# Patient Record
Sex: Male | Born: 1955 | Race: Black or African American | Hispanic: No | Smoking: Never smoker
Health system: Southern US, Community
[De-identification: ages and names within clinical notes are randomized; demographics above are authoritative.]

## PROBLEM LIST (undated history)

## (undated) DIAGNOSIS — T7840XA Allergy, unspecified, initial encounter: Secondary | ICD-10-CM

## (undated) DIAGNOSIS — E785 Hyperlipidemia, unspecified: Secondary | ICD-10-CM

## (undated) DIAGNOSIS — M1990 Unspecified osteoarthritis, unspecified site: Secondary | ICD-10-CM

## (undated) DIAGNOSIS — K5190 Ulcerative colitis, unspecified, without complications: Secondary | ICD-10-CM

## (undated) DIAGNOSIS — I10 Essential (primary) hypertension: Secondary | ICD-10-CM

## (undated) HISTORY — DX: Ulcerative colitis, unspecified, without complications: K51.90

## (undated) HISTORY — DX: Unspecified osteoarthritis, unspecified site: M19.90

## (undated) HISTORY — DX: Hyperlipidemia, unspecified: E78.5

## (undated) HISTORY — DX: Allergy, unspecified, initial encounter: T78.40XA

## (undated) HISTORY — DX: Essential (primary) hypertension: I10

## (undated) HISTORY — PX: ARTHROSCOPIC REPAIR ACL: SUR80

---

## 2006-05-12 ENCOUNTER — Encounter: Admission: RE | Admit: 2006-05-12 | Discharge: 2006-05-12 | Payer: Self-pay | Admitting: Internal Medicine

## 2007-03-29 IMAGING — CR DG CHEST 2V
2 series · 2 of 2 positions shown · non-contrast
Comparison: none

CLINICAL DATA: Hypertension.  Evaluate heart size. 
 CHEST, TWO VIEWS: 
 No comparison. 
 Heart size is upper normal with a cardiothoracic ratio of 157/342.  There is no heart failure, infiltrate, or effusion.  Lungs are clear.

[view not recorded (1 of 2)]
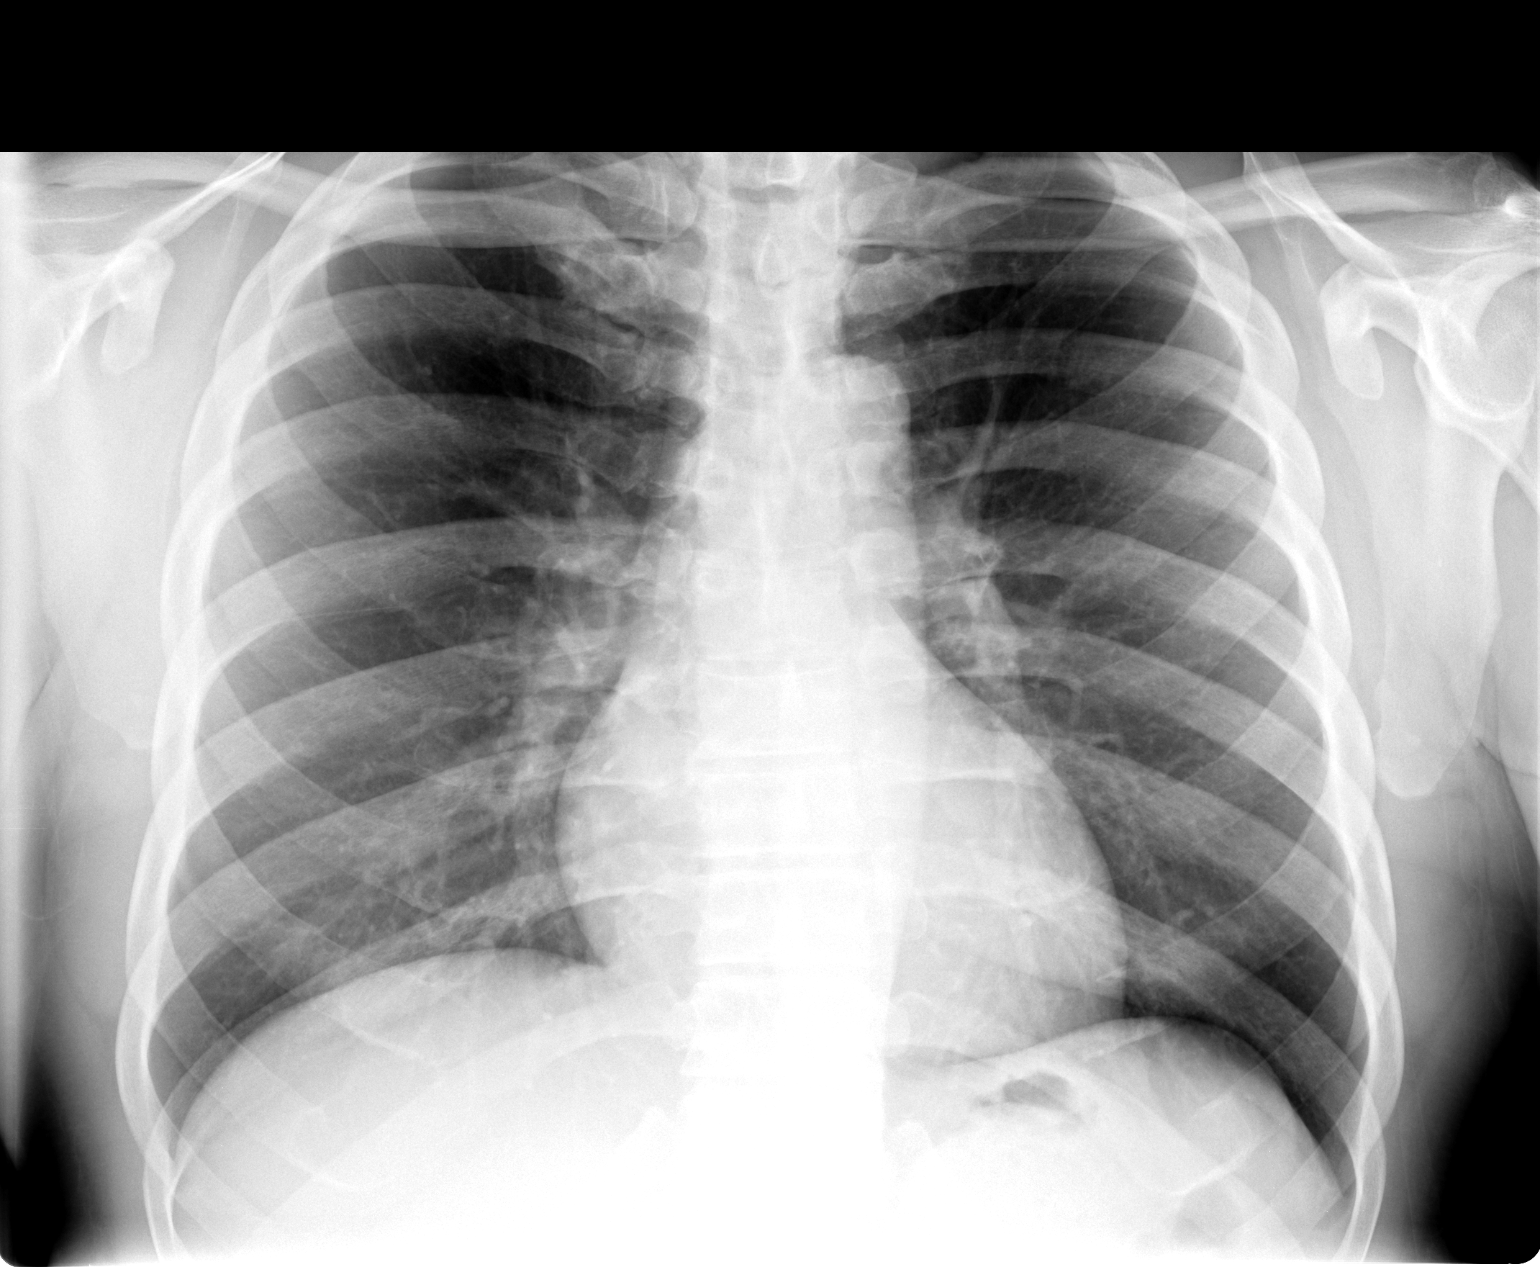

[view not recorded (2 of 2)]
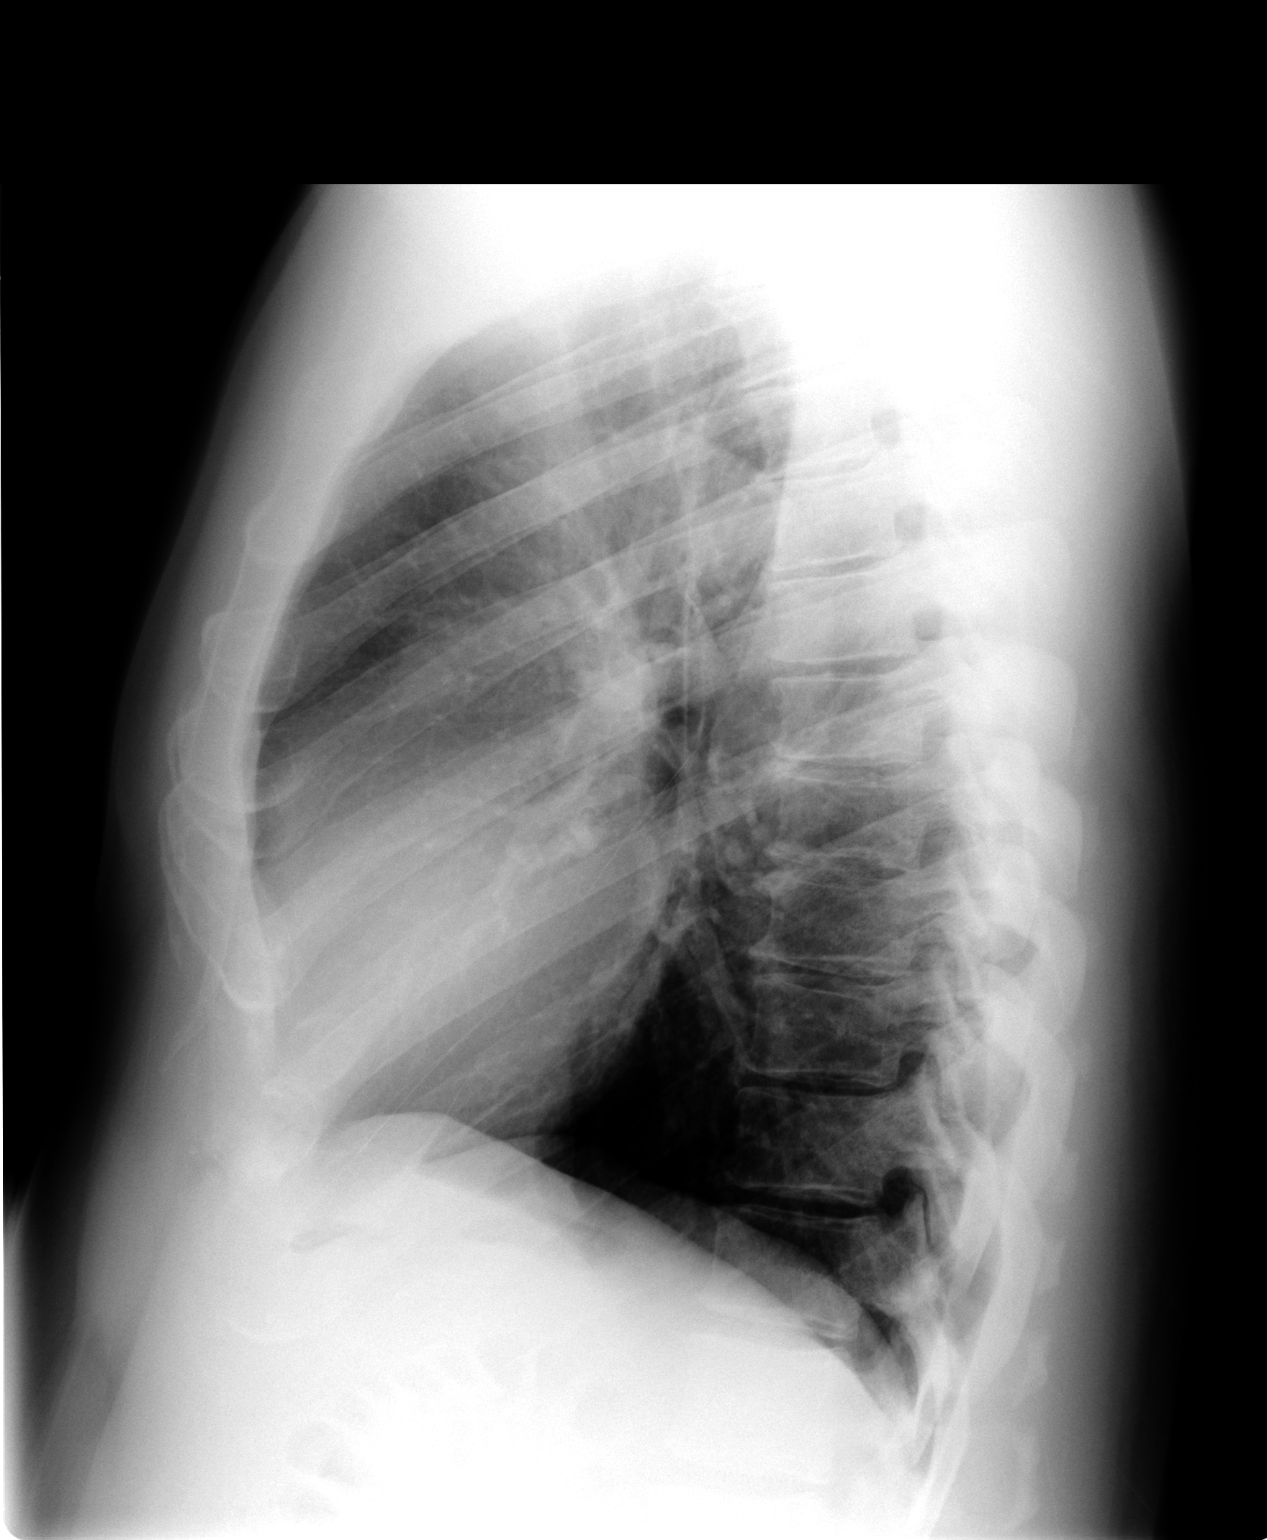

[2 of 2 positions shown; findings below may reference images not displayed]

IMPRESSION: Heart is at the upper limit of normal in size.  No acute abnormality.

## 2011-02-23 ENCOUNTER — Other Ambulatory Visit: Payer: Self-pay | Admitting: Internal Medicine

## 2011-02-23 DIAGNOSIS — E785 Hyperlipidemia, unspecified: Secondary | ICD-10-CM | POA: Insufficient documentation

## 2011-02-23 DIAGNOSIS — J301 Allergic rhinitis due to pollen: Secondary | ICD-10-CM | POA: Insufficient documentation

## 2011-02-23 DIAGNOSIS — I10 Essential (primary) hypertension: Secondary | ICD-10-CM | POA: Insufficient documentation

## 2011-03-09 ENCOUNTER — Encounter: Payer: Self-pay | Admitting: Internal Medicine

## 2020-12-31 ENCOUNTER — Encounter: Payer: Self-pay | Admitting: Podiatry

## 2020-12-31 ENCOUNTER — Ambulatory Visit (INDEPENDENT_AMBULATORY_CARE_PROVIDER_SITE_OTHER): Payer: PRIVATE HEALTH INSURANCE | Admitting: Podiatry

## 2020-12-31 ENCOUNTER — Other Ambulatory Visit: Payer: Self-pay

## 2020-12-31 DIAGNOSIS — K4090 Unilateral inguinal hernia, without obstruction or gangrene, not specified as recurrent: Secondary | ICD-10-CM | POA: Insufficient documentation

## 2020-12-31 DIAGNOSIS — K5150 Left sided colitis without complications: Secondary | ICD-10-CM | POA: Insufficient documentation

## 2020-12-31 DIAGNOSIS — K625 Hemorrhage of anus and rectum: Secondary | ICD-10-CM | POA: Insufficient documentation

## 2020-12-31 DIAGNOSIS — K4020 Bilateral inguinal hernia, without obstruction or gangrene, not specified as recurrent: Secondary | ICD-10-CM | POA: Insufficient documentation

## 2020-12-31 DIAGNOSIS — M7752 Other enthesopathy of left foot: Secondary | ICD-10-CM

## 2020-12-31 DIAGNOSIS — R197 Diarrhea, unspecified: Secondary | ICD-10-CM | POA: Insufficient documentation

## 2020-12-31 DIAGNOSIS — L989 Disorder of the skin and subcutaneous tissue, unspecified: Secondary | ICD-10-CM

## 2020-12-31 DIAGNOSIS — R634 Abnormal weight loss: Secondary | ICD-10-CM | POA: Insufficient documentation

## 2020-12-31 DIAGNOSIS — M79672 Pain in left foot: Secondary | ICD-10-CM

## 2020-12-31 DIAGNOSIS — R194 Change in bowel habit: Secondary | ICD-10-CM | POA: Insufficient documentation

## 2020-12-31 MED ORDER — BETAMETHASONE SOD PHOS & ACET 6 (3-3) MG/ML IJ SUSP
3.0000 mg | Freq: Once | INTRAMUSCULAR | Status: AC
  Administered 2020-12-31: 3 mg via INTRA_ARTICULAR

## 2020-12-31 NOTE — Progress Notes (Signed)
   Subjective: 65 y.o. male presenting to the office today as a new patient for evaluation of left forefoot pain secondary to callus lesions of developed.  He states he has been dealing with calluses that are very symptomatic for the past 8 years.  He has not done anything for treatment other than trying to shave them down himself.  He presents for further treatment and evaluation   Past Medical History:  Diagnosis Date  . Allergy   . Hyperlipidemia   . Hypertension   . Osteoarthrosis, unspecified whether generalized or localized, unspecified site      Objective:  Physical Exam General: Alert and oriented x3 in no acute distress  Dermatology: Hyperkeratotic lesion(s) present on the subfirst and fifth MTPJ left foot. Pain on palpation with a central nucleated core noted. Skin is warm, dry and supple bilateral lower extremities. Negative for open lesions or macerations.  Vascular: Palpable pedal pulses bilaterally. No edema or erythema noted. Capillary refill within normal limits.  Neurological: Epicritic and protective threshold grossly intact bilaterally.   Musculoskeletal Exam: Pain on palpation at the keratotic lesion(s) noted. Range of motion within normal limits bilateral. Muscle strength 5/5 in all groups bilateral.  Today there is also pain on palpation range of motion to the fifth MTPJ of the left foot  Assessment: 1.  Porokeratosis x2 left foot 2.  Fifth MTPJ capsulitis left   Plan of Care:  1. Patient evaluated 2. Excisional debridement of keratoic lesion(s) using a chisel blade was performed without incident.  3. Dressed area with light dressing. 4.  Recommend OTC corn callus remover x4 weeks daily  5.  Injection of 0.5 cc Celestone Soluspan injected in the  fifth MTPJ left 6.  Note for work was provided today.  No work x2 days  7.  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 Felecia Shelling, DPM    [address] [place], [place] [address]                Office [phone]  Fax [phone]

## 2020-12-31 NOTE — Patient Instructions (Signed)
Recommend over-the-counter Corn and callus remover.  The active ingredient is salicylic acid.  It is very inexpensive and available at any pharmacy

## 2021-01-28 ENCOUNTER — Other Ambulatory Visit: Payer: Self-pay

## 2021-01-28 ENCOUNTER — Ambulatory Visit (INDEPENDENT_AMBULATORY_CARE_PROVIDER_SITE_OTHER): Payer: PRIVATE HEALTH INSURANCE | Admitting: Podiatry

## 2021-01-28 DIAGNOSIS — L989 Disorder of the skin and subcutaneous tissue, unspecified: Secondary | ICD-10-CM

## 2021-01-28 DIAGNOSIS — M79672 Pain in left foot: Secondary | ICD-10-CM

## 2021-01-28 DIAGNOSIS — M7752 Other enthesopathy of left foot: Secondary | ICD-10-CM

## 2021-01-28 NOTE — Progress Notes (Signed)
   Subjective: 65 y.o. male presenting to the office today for follow-up evaluation of porokeratosis and capsulitis to the left foot.  Patient states that he is feeling much better.  He has been applying the OTC corn callus remover as instructed.  No new complaints at this time   Past Medical History:  Diagnosis Date  . Allergy   . Hyperlipidemia   . Hypertension   . Osteoarthrosis, unspecified whether generalized or localized, unspecified site      Objective:  Physical Exam General: Alert and oriented x3 in no acute distress  Dermatology: Significantly improved hyperkeratotic lesion(s) present on the subfirst and fifth MTPJ left foot.  Negative for any significant pain on palpation. Skin is warm, dry and supple bilateral lower extremities. Negative for open lesions or macerations.  Vascular: Palpable pedal pulses bilaterally. No edema or erythema noted. Capillary refill within normal limits.  Neurological: Epicritic and protective threshold grossly intact bilaterally.   Musculoskeletal Exam: Negative for pain on palpation at the keratotic lesion(s). Range of motion within normal limits bilateral. Muscle strength 5/5 in all groups bilateral.   Assessment: 1.  Porokeratosis x2 left foot; resolved 2.  Fifth MTPJ capsulitis left; resolved   Plan of Care:  1. Patient evaluated 2.  Light debridement performed using a tissue nipper without incident or bleeding 3.  Recommend good supportive shoes 4.  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 Felecia Shelling, DPM    [address] [place], [place] [address]                Office [phone]  Fax [phone]

## 2022-06-28 ENCOUNTER — Other Ambulatory Visit: Payer: Self-pay

## 2022-06-28 ENCOUNTER — Encounter (HOSPITAL_COMMUNITY): Payer: Self-pay | Admitting: Family Medicine

## 2022-06-28 ENCOUNTER — Encounter (HOSPITAL_COMMUNITY): Payer: Self-pay

## 2022-06-28 ENCOUNTER — Observation Stay (HOSPITAL_COMMUNITY)
Admission: AD | Admit: 2022-06-28 | Discharge: 2022-06-29 | Disposition: A | Discharge status: Home / Self Care | Payer: Medicare Other | Attending: Family Medicine | Admitting: Family Medicine

## 2022-06-28 DIAGNOSIS — K5190 Ulcerative colitis, unspecified, without complications: Secondary | ICD-10-CM | POA: Diagnosis not present

## 2022-06-28 DIAGNOSIS — I824Y2 Acute embolism and thrombosis of unspecified deep veins of left proximal lower extremity: Secondary | ICD-10-CM | POA: Diagnosis not present

## 2022-06-28 DIAGNOSIS — Z79899 Other long term (current) drug therapy: Secondary | ICD-10-CM | POA: Insufficient documentation

## 2022-06-28 DIAGNOSIS — I82409 Acute embolism and thrombosis of unspecified deep veins of unspecified lower extremity: Principal | ICD-10-CM | POA: Diagnosis present

## 2022-06-28 DIAGNOSIS — I82442 Acute embolism and thrombosis of left tibial vein: Secondary | ICD-10-CM | POA: Insufficient documentation

## 2022-06-28 DIAGNOSIS — I82412 Acute embolism and thrombosis of left femoral vein: Secondary | ICD-10-CM | POA: Diagnosis not present

## 2022-06-28 DIAGNOSIS — I10 Essential (primary) hypertension: Secondary | ICD-10-CM | POA: Diagnosis not present

## 2022-06-28 DIAGNOSIS — I82402 Acute embolism and thrombosis of unspecified deep veins of left lower extremity: Secondary | ICD-10-CM | POA: Diagnosis present

## 2022-06-28 DIAGNOSIS — Z7901 Long term (current) use of anticoagulants: Secondary | ICD-10-CM | POA: Diagnosis not present

## 2022-06-28 DIAGNOSIS — I82432 Acute embolism and thrombosis of left popliteal vein: Secondary | ICD-10-CM | POA: Insufficient documentation

## 2022-06-28 DIAGNOSIS — E785 Hyperlipidemia, unspecified: Secondary | ICD-10-CM | POA: Diagnosis present

## 2022-06-28 LAB — CBC
HCT: 38.4 % — ABNORMAL LOW (ref 39.0–52.0)
Hemoglobin: 12.9 g/dL — ABNORMAL LOW (ref 13.0–17.0)
MCH: 30.9 pg (ref 26.0–34.0)
MCHC: 33.6 g/dL (ref 30.0–36.0)
MCV: 91.9 fL (ref 80.0–100.0)
Platelets: 167 10*3/uL (ref 150–400)
RBC: 4.18 MIL/uL — ABNORMAL LOW (ref 4.22–5.81)
RDW: 13.1 % (ref 11.5–15.5)
WBC: 5.3 10*3/uL (ref 4.0–10.5)
nRBC: 0 % (ref 0.0–0.2)

## 2022-06-28 LAB — BASIC METABOLIC PANEL
Anion gap: 9 (ref 5–15)
BUN: 8 mg/dL (ref 8–23)
CO2: 24 mmol/L (ref 22–32)
Calcium: 8.9 mg/dL (ref 8.9–10.3)
Chloride: 105 mmol/L (ref 98–111)
Creatinine, Ser: 1.06 mg/dL (ref 0.61–1.24)
GFR, Estimated: >60 mL/min (ref >60)
Glucose, Bld: 96 mg/dL (ref 70–99)
Potassium: 3.8 mmol/L (ref 3.5–5.1)
Sodium: 138 mmol/L (ref 135–145)

## 2022-06-28 LAB — HIV ANTIBODY (ROUTINE TESTING W REFLEX): HIV Screen 4th Generation wRfx: NONREACTIVE

## 2022-06-28 MED ORDER — NEBIVOLOL HCL 10 MG PO TABS
20.0000 mg | ORAL_TABLET | Freq: Every day | ORAL | Status: DC
  Administered 2022-06-29: 20 mg via ORAL
  Filled 2022-06-28: qty 2

## 2022-06-28 MED ORDER — SPIRONOLACTONE 25 MG PO TABS
25.0000 mg | ORAL_TABLET | Freq: Two times a day (BID) | ORAL | Status: DC
  Administered 2022-06-28 – 2022-06-29 (×2): 25 mg via ORAL
  Filled 2022-06-28 (×2): qty 1

## 2022-06-28 MED ORDER — VITAMIN D 25 MCG (1000 UNIT) PO TABS
1000.0000 [IU] | ORAL_TABLET | Freq: Every day | ORAL | Status: DC
  Administered 2022-06-29: 1000 [IU] via ORAL
  Filled 2022-06-28: qty 1

## 2022-06-28 MED ORDER — MESALAMINE 1.2 G PO TBEC
2.4000 g | DELAYED_RELEASE_TABLET | Freq: Two times a day (BID) | ORAL | Status: DC
  Administered 2022-06-29: 2.4 g via ORAL
  Filled 2022-06-28 (×3): qty 2

## 2022-06-28 MED ORDER — HEPARIN (PORCINE) 25000 UT/250ML-% IV SOLN
1500.0000 [IU]/h | INTRAVENOUS | Status: DC
  Administered 2022-06-28: 1500 [IU]/h via INTRAVENOUS
  Filled 2022-06-28: qty 250

## 2022-06-28 MED ORDER — VITAMIN E 45 MG (100 UNIT) PO CAPS
1000.0000 [IU] | ORAL_CAPSULE | Freq: Every day | ORAL | Status: DC
  Administered 2022-06-29: 1000 [IU] via ORAL
  Filled 2022-06-28: qty 10

## 2022-06-28 MED ORDER — HEPARIN BOLUS VIA INFUSION
2500.0000 [IU] | Freq: Once | INTRAVENOUS | Status: AC
  Administered 2022-06-28: 2500 [IU] via INTRAVENOUS
  Filled 2022-06-28: qty 2500

## 2022-06-28 NOTE — H&P (Signed)
History and Physical    Patient: Clifford Roberts [id number] DOB: 07/14/1956 DOA: 06/28/2022 DOS: the patient was seen and examined on 06/29/2022 PCP: Gwenyth Bender, MD  Patient coming from: Home  Chief Complaint: No chief complaint on file.  HPI: Clifford Roberts is a 66 y.o. male with medical history significant of HTN, HLD, ulcerative colitis on Humira who presents as a direct admission from PCP for left LE DVT.   He reports about July 19th he picked up his 3yo granddaughter. Felt his left hamstring pop. That gradually resolved but then pain went down to calf and had increase LE edema. He also traveled to [place] to visit a family member aroumd that time.  No chest pain, shortness of breath or palpitation. No paresthesia.  No recent surgery. No tobacco or illicit drug use. No personal hx of thrombosis. Last used prednisone about 3 weeks ago for a month.   Review of Systems: As mentioned in the history of present illness. All other systems reviewed and are negative. Past Medical History:  Diagnosis Date   Allergy    Hyperlipidemia    Hypertension    Osteoarthrosis, unspecified whether generalized or localized, unspecified site     Social History:  has no history on file for tobacco use, alcohol use, and drug use.  Allergies  Allergen Reactions   Prochlorperazine Other (See Comments)    Causes Stroke Symptoms    No pertinent family hx   Prior to Admission medications   Medication Sig Start Date End Date Taking? Authorizing Provider  cholecalciferol (VITAMIN D3) 25 MCG (1000 UNIT) tablet Take 1,000 Units by mouth daily.   Yes [provider]  mesalamine (LIALDA) 1.2 g EC tablet Take 2.4 g by mouth in the morning and at bedtime. Take 2 tablets (2.2 mg) BID 12/11/20  Yes [provider]  Nebivolol HCl 20 MG TABS Take 1 tablet by mouth daily. 05/09/22  Yes [provider]  predniSONE (DELTASONE) 10 MG tablet Take 40 mg by mouth daily as needed (colitis).  10/27/20  Yes [provider]  spironolactone (ALDACTONE) 25 MG tablet Take 25 mg by mouth 2 (two) times daily. 03/13/22  Yes [provider]  TURMERIC PO Take 1 capsule by mouth daily.   Yes [provider]  vitamin E 1000 UNIT capsule Take 1,000 Units by mouth daily.   Yes [provider]    Physical Exam: Vitals:   06/28/22 1832 06/28/22 1948 06/28/22 2227  BP: 138/73  122/77  Pulse: 65  65  Resp: 16  15  Temp: 98.9 F (37.2 C)  97.8 F (36.6 C)  TempSrc: Oral  Oral  SpO2: 100%  100%  Weight:  93.7 kg   Height:  6' 0.5" (1.842 m)    Constitutional: NAD, calm, comfortable, well appearing elderly male sitting upright in recliner chair  Eyes:  lids and conjunctivae normal ENMT: Mucous membranes are moist.  Neck: normal, supple Respiratory: clear to auscultation bilaterally, no wheezing, no crackles. Normal respiratory effort. No accessory muscle use.  Cardiovascular: Regular rate and rhythm, no murmurs / rubs / gallops. +3 pitting edema of the left LE edema up to mid-pretibial region. +2 dorsalis pedis pulse Abdomen: soft, no tenderness,  Bowel sounds positive.  Musculoskeletal: no clubbing / cyanosis. No joint deformity upper and lower extremities. Good ROM, no contractures. Normal muscle tone.  +2 dorsalis pedis pulse of the left lower extremity without any discoloration, changes in temperature. Skin: no rashes, lesions, ulcers.  No induration Neurologic: CN 2-12 grossly intact. Sensation intact, Strength 5/5 in all 4.  Psychiatric: Normal judgment and insight. Alert and oriented x 3. Normal mood. Data Reviewed:  See HPI  Assessment and Plan: * DVT (deep venous thrombosis) (HCC) -Possibly provoked from recent month long steroid use for his ulcerative colitis.  -Unfortunately records of ultrasound sent to wrong fax number and imaging office now closed. Need to call [facility] [phone] in the morning for full ultrasound report to  see extend of his DVT. -Left lower extremity exam is reassuring.  No urgent need for any vascular intervention at this time. -Start IV heparin infusion   Essential hypertension Continue nebivolol, spironolactone  Ulcerative colitis (HCC) On q2 weeks Humira. Follows with Dr. Jeani Hawking GI.       Advance Care Planning:   Code Status: Full Code   Consults: none  Family Communication: none at bedside  Severity of Illness: The appropriate patient status for this patient is OBSERVATION. Observation status is judged to be reasonable and necessary in order to provide the required intensity of service to ensure the patient's safety. The patient's presenting symptoms, physical exam findings, and initial radiographic and laboratory data in the context of their medical condition is felt to place them at decreased risk for further clinical deterioration. Furthermore, it is anticipated that the patient will be medically stable for discharge from the hospital within 2 midnights of admission.   Author: Anselm Jungling, DO 06/29/2022 1:57 AM  For on call review [email or web address].

## 2022-06-28 NOTE — Progress Notes (Signed)
ANTICOAGULATION CONSULT NOTE - Initial Consult  Pharmacy Consult for IV heparin Indication: DVT  Allergies  Allergen Reactions   Prochlorperazine Other (See Comments)    Causes Stroke Symptoms    Patient Measurements: Height: 6' 0.5" (184.2 cm) Weight: 93.7 kg (206 lb 9.6 oz) IBW/kg (Calculated) : 78.75 Heparin Dosing Weight: 93.7 kg  Vital Signs: Temp: 98.9 F (37.2 C) (08/14 1832) Temp Source: Oral (08/14 1832) BP: 138/73 (08/14 1832) Pulse Rate: 65 (08/14 1832)  Labs: No results for input(s): "HGB", "HCT", "PLT", "APTT", "LABPROT", "INR", "HEPARINUNFRC", "HEPRLOWMOCWT", "CREATININE", "CKTOTAL", "CKMB", "TROPONINIHS" in the last 72 hours.  CrCl cannot be calculated (No successful lab value found.).   Medical History: Past Medical History:  Diagnosis Date   Allergy    Hyperlipidemia    Hypertension    Osteoarthrosis, unspecified whether generalized or localized, unspecified site     Medications:  Scheduled:  Infusions:   Assessment: 66 yo male directly admitted after outpatient venous doppler showing +new DVT. To start IV heparin per pharmacy. Baseline labs ordered  Goal of Therapy:  Heparin level 0.3-0.7 units/ml Monitor platelets by anticoagulation protocol: Yes   Plan:  After labs have been drawn, start IV heparin with bolus of 2500 units then Start IV Heparin rate at 1500 units/hr Check heparin level 6 hours after start of IV heparin Daily CBC and heparin level  Chief, Walkup 06/28/2022,8:04 PM

## 2022-06-28 NOTE — Assessment & Plan Note (Signed)
On q2 weeks Humira. Follows with Dr. Jeani Hawking GI.

## 2022-06-29 DIAGNOSIS — I82412 Acute embolism and thrombosis of left femoral vein: Secondary | ICD-10-CM

## 2022-06-29 DIAGNOSIS — I82442 Acute embolism and thrombosis of left tibial vein: Secondary | ICD-10-CM

## 2022-06-29 DIAGNOSIS — I82432 Acute embolism and thrombosis of left popliteal vein: Secondary | ICD-10-CM

## 2022-06-29 DIAGNOSIS — I824Y2 Acute embolism and thrombosis of unspecified deep veins of left proximal lower extremity: Secondary | ICD-10-CM | POA: Diagnosis not present

## 2022-06-29 LAB — CBC
HCT: 38.6 % — ABNORMAL LOW (ref 39.0–52.0)
Hemoglobin: 13.1 g/dL (ref 13.0–17.0)
MCH: 31.3 pg (ref 26.0–34.0)
MCHC: 33.9 g/dL (ref 30.0–36.0)
MCV: 92.3 fL (ref 80.0–100.0)
Platelets: 157 10*3/uL (ref 150–400)
RBC: 4.18 MIL/uL — ABNORMAL LOW (ref 4.22–5.81)
RDW: 13 % (ref 11.5–15.5)
WBC: 4.7 10*3/uL (ref 4.0–10.5)
nRBC: 0 % (ref 0.0–0.2)

## 2022-06-29 LAB — HEPARIN LEVEL (UNFRACTIONATED)
Heparin Unfractionated: 0.37 IU/mL (ref 0.30–0.70)
Heparin Unfractionated: 0.29 IU/mL — ABNORMAL LOW (ref 0.30–0.70)

## 2022-06-29 MED ORDER — APIXABAN 5 MG PO TABS
10.0000 mg | ORAL_TABLET | Freq: Two times a day (BID) | ORAL | Status: DC
  Administered 2022-06-29: 10 mg via ORAL
  Filled 2022-06-29: qty 2

## 2022-06-29 MED ORDER — APIXABAN 5 MG PO TABS: 5.0000 mg | ORAL_TABLET | Freq: Two times a day (BID) | ORAL | Status: DC

## 2022-06-29 MED ORDER — APIXABAN (ELIQUIS) VTE STARTER PACK (10MG AND 5MG): ORAL_TABLET | ORAL | 0 refills | Status: DC

## 2022-06-29 MED ORDER — HEPARIN (PORCINE) 25000 UT/250ML-% IV SOLN
1650.0000 [IU]/h | INTRAVENOUS | Status: DC
  Administered 2022-06-29: 1650 [IU]/h via INTRAVENOUS
  Filled 2022-06-29: qty 250

## 2022-06-29 MED ORDER — HEPARIN BOLUS VIA INFUSION
1500.0000 [IU] | Freq: Once | INTRAVENOUS | Status: AC
Start: 2022-06-29 — End: 2022-06-29
  Administered 2022-06-29: 1500 [IU] via INTRAVENOUS
  Filled 2022-06-29: qty 1500

## 2022-06-29 NOTE — Discharge Instructions (Signed)
Information on my medicine - ELIQUIS (apixaban)  Why was Eliquis prescribed for you? Eliquis was prescribed to treat blood clots that may have been found in the veins of your legs (deep vein thrombosis) or in your lungs (pulmonary embolism) and to reduce the risk of them occurring again.  What do You need to know about Eliquis ? The starting dose is 10 mg (two 5 mg tablets) taken TWICE daily for the FIRST SEVEN (7) DAYS, then on 07/06/22  the dose is reduced to ONE 5 mg tablet taken TWICE daily.  Eliquis may be taken with or without food.   Try to take the dose about the same time in the morning and in the evening. If you have difficulty swallowing the tablet whole please discuss with your pharmacist how to take the medication safely.  Take Eliquis exactly as prescribed and DO NOT stop taking Eliquis without talking to the doctor who prescribed the medication.  Stopping may increase your risk of developing a new blood clot.  Refill your prescription before you run out.  After discharge, you should have regular check-up appointments with your healthcare provider that is prescribing your Eliquis.    What do you do if you miss a dose? If a dose of ELIQUIS is not taken at the scheduled time, take it as soon as possible on the same day and twice-daily administration should be resumed. The dose should not be doubled to make up for a missed dose.  Important Safety Information A possible side effect of Eliquis is bleeding. You should call your healthcare provider right away if you experience any of the following: Bleeding from an injury or your nose that does not stop. Unusual colored urine (red or dark brown) or unusual colored stools (red or black). Unusual bruising for unknown reasons. A serious fall or if you hit your head (even if there is no bleeding).  Some medicines may interact with Eliquis and might increase your risk of bleeding or clotting while on Eliquis. To help avoid this,  consult your healthcare provider or pharmacist prior to using any new prescription or non-prescription medications, including herbals, vitamins, non-steroidal anti-inflammatory drugs (NSAIDs) and supplements.  This website has more information on Eliquis (apixaban): http://www.eliquis.com/eliquis/home

## 2022-06-29 NOTE — H&P (View-Only) (Signed)
[facility]  Patient name: Clifford Roberts MRN: [id number] DOB: [birth year] Sex: male   REQUESTING PROVIDER:    Hospitalists   REASON FOR CONSULT:    Left leg DVT  HISTORY OF PRESENT ILLNESS:   Clifford Roberts is a 66 y.o. male, who was sent to the hospital by his PCP for a left leg DVT which involve the left common femoral vein, femoral vein popliteal vein and tibial veins.  On July 19, the patient states that he began having some discomfort in his left leg and felt a pop around his hamstring when he picked up his granddaughter.  About 2 to 3 weeks ago, he began having worsening issues and having some swelling which has persisted.  It has gotten better but has not resolved.  He denies any chest pain or shortness of breath.  He states that there is a family history of clotting, however he has not had any prior issues.  He was admitted for anticoagulation.  He is medically managed for hypertension and hyperlipidemia.    PAST MEDICAL HISTORY    Past Medical History:  Diagnosis Date   Allergy    Hyperlipidemia    Hypertension    Osteoarthrosis, unspecified whether generalized or localized, unspecified site      FAMILY HISTORY   No family history on file.  SOCIAL HISTORY:   Social History   Socioeconomic History   Marital status: Married    Spouse name: Not on file   Number of children: Not on file   Years of education: Not on file   Highest education level: Not on file  Occupational History   Not on file  Tobacco Use   Smoking status: Not on file   Smokeless tobacco: Not on file  Vaping Use   Vaping Use: Never used  Substance and Sexual Activity   Alcohol use: Not on file   Drug use: Not on file   Sexual activity: Not on file  Other Topics Concern   Not on file  Social History Narrative   Not on file   Social Determinants of Health   Financial Resource Strain: Not on file  Food Insecurity: Not  on file  Transportation Needs: Not on file  Physical Activity: Not on file  Stress: Not on file  Social Connections: Not on file  Intimate Partner Violence: Not on file    ALLERGIES:    Allergies  Allergen Reactions   Prochlorperazine Other (See Comments)    Causes Stroke Symptoms    CURRENT MEDICATIONS:    Current Facility-Administered Medications  Medication Dose Route Frequency Provider Last Rate Last Admin   apixaban (ELIQUIS) tablet 10 mg  10 mg Oral BID Edwin Dada, MD   10 mg at 06/29/22 1836   Followed by   Derrill Memo ON 07/06/2022] apixaban (ELIQUIS) tablet 5 mg  5 mg Oral BID Danford, Suann Larry, MD       cholecalciferol (VITAMIN D3) 25 MCG (1000 UNIT) tablet 1,000 Units  1,000 Units Oral Daily Tu, Ching T, DO   1,000 Units at 06/29/22 0913   mesalamine (LIALDA) EC tablet 2.4 g  2.4 g Oral BID Tu, Ching T, DO   2.4 g at 06/29/22 0914   nebivolol (BYSTOLIC) tablet 20 mg  20 mg Oral Daily Tu, Ching T, DO   20 mg at 06/29/22 [id number]   spironolactone (ALDACTONE) tablet 25 mg  25 mg Oral BID Tu, Ching T, DO   25 mg  at 06/29/22 0914   vitamin E capsule 1,000 Units  1,000 Units Oral Daily Tu, Ching T, DO   1,000 Units at 06/29/22 6237   Current Outpatient Medications  Medication Sig Dispense Refill   Adalimumab (HUMIRA) 40 MG/0.4ML PSKT Inject 40 mg into the skin every 14 (fourteen) days.     APIXABAN (ELIQUIS) VTE STARTER PACK (10MG  AND 5MG ) Take as directed on package: start with two-5mg  tablets twice daily for 7 days. On day 8, switch to one-5mg  tablet twice daily. 1 each 0   cholecalciferol (VITAMIN D3) 25 MCG (1000 UNIT) tablet Take 1,000 Units by mouth daily.     mesalamine (LIALDA) 1.2 g EC tablet Take 2.4 g by mouth in the morning and at bedtime. Take 2 tablets (2.2 mg) BID     Nebivolol HCl 20 MG TABS Take 1 tablet by mouth daily.     predniSONE (DELTASONE) 10 MG tablet Take 40 mg by mouth daily as needed (colitis).     spironolactone (ALDACTONE) 25 MG tablet  Take 25 mg by mouth 2 (two) times daily.     TURMERIC PO Take 1 capsule by mouth daily.     vitamin E 1000 UNIT capsule Take 1,000 Units by mouth daily.      REVIEW OF SYSTEMS:   [X]  denotes positive finding, [ ]  denotes negative finding Cardiac  Comments:  Chest pain or chest pressure:    Shortness of breath upon exertion:    Short of breath when lying flat:    Irregular heart rhythm:        Vascular    Pain in calf, thigh, or hip brought on by ambulation:    Pain in feet at night that wakes you up from your sleep:     Blood clot in your veins:    Leg swelling:  x       Pulmonary    Oxygen at home:    Productive cough:     Wheezing:         Neurologic    Sudden weakness in arms or legs:     Sudden numbness in arms or legs:     Sudden onset of difficulty speaking or slurred speech:    Temporary loss of vision in one eye:     Problems with dizziness:         Gastrointestinal    Blood in stool:      Vomited blood:         Genitourinary    Burning when urinating:     Blood in urine:        Psychiatric    Major depression:         Hematologic    Bleeding problems:    Problems with blood clotting too easily:        Skin    Rashes or ulcers:        Constitutional    Fever or chills:     PHYSICAL EXAM:   Vitals:   06/28/22 2227 06/29/22 0240 06/29/22 0638 06/29/22 1408  BP: 122/77 127/78 (!) 144/81 134/78  Pulse: 65 64 (!) 58 (!) 55  Resp: 15 19 16 16   Temp: 97.8 F (36.6 C) 97.9 F (36.6 C) 98 F (36.7 C) 98.1 F (36.7 C)  TempSrc: Oral Oral Oral Oral  SpO2: 100% 98% 100% 100%  Weight:      Height:        GENERAL: The patient is a well-nourished male, in no acute distress.  The vital signs are documented above. CARDIAC: There is a regular rate and rhythm.  VASCULAR: Left leg pitting edema PULMONARY: Nonlabored respirations MUSCULOSKELETAL: There are no major deformities or cyanosis. NEUROLOGIC: No focal weakness or paresthesias are  detected. SKIN: There are no ulcers or rashes noted. PSYCHIATRIC: The patient has a normal affect.  STUDIES:   I have reviewed his outside ultrasound report which shows a DVT in the left common femoral, femoral, popliteal, and tibial veins  ASSESSMENT and PLAN   Left leg DVT: I had a long conversation with the patient discussing our treatment options.  We could continue with anticoagulation or consider mechanical thrombectomy via a left popliteal approach.  I discussed that with anticoagulation alone, he would be at risk for post thrombotic issues.  His risk for this will be less with mechanical thrombectomy.  He would like to think about this but is leaning towards intervention.  From my perspective, he can be transition to a DOAC and discharge.  I have given him my personal cell phone and our office number.  He will contact us in the next couple days with his final decision.  Because of his family history of clotting disorders, he will ultimately need referral to hematology to determine the appropriate length of anticoagulation.   Charlena Cross, MD, FACS [facility] [phone] Pager [phone]

## 2022-06-29 NOTE — Assessment & Plan Note (Addendum)
-   Continue nebivolol, spironolactone

## 2022-06-29 NOTE — Hospital Course (Signed)
Clifford Roberts is a 66 y.o. M with HTN, ulcerative colitis on Humira who presented for LE DVT.  Evidently had injury to leg a few weeks ago, developed severe swelling, got [place] that showed "extensive DVT".  PCP asked for admission for vascular surgery consultation.

## 2022-06-29 NOTE — Progress Notes (Signed)
  Progress Note   Patient: Clifford Roberts [id number]:[id number] DOB: [birth year] DOA: 06/28/2022     1 DOS: the patient was seen and examined on 06/29/2022 at 10:15AM      Brief hospital course: Mr. Weber is a 66 y.o. M with HTN, ulcerative colitis on Humira who presented for LE DVT.  Evidently had injury to leg a few weeks ago, developed severe swelling, got [place] that showed "extensive DVT".  PCP asked for admission for vascular surgery consultation.     Assessment and Plan: * DVT (deep venous thrombosis) (HCC) [place] from common femoral vein to distal veins.  Discussed with Vascular surgery, may need intervention. - Continue heparin gtt - Consult Gen Surg    Ulcerative colitis (HCC) On  Humira. Follows with Dr.  Elnoria Howard  Essential hypertension - Continue nebivolol, spironolactone          Subjective: Patient has moderate tenderness in the left leg, proximal and distal, moderate swelling.  No fever, no confusion, no respiratory distress, no chest pain     Physical Exam: Vitals:   06/28/22 2227 06/29/22 0240 06/29/22 0638 06/29/22 1408  BP: 122/77 127/78 (!) 144/81 134/78  Pulse: 65 64 (!) 58 (!) 55  Resp: 15 19 16 16   Temp: 97.8 F (36.6 C) 97.9 F (36.6 C) 98 F (36.7 C) 98.1 F (36.7 C)  TempSrc: Oral Oral Oral Oral  SpO2: 100% 98% 100% 100%  Weight:      Height:       Elderly adult male, sitting up in recliner, interactive and appropriate RRR, no murmurs, no peripheral edema Respiratory rate normal, lungs clear without rales or wheezes Abdomen soft without tenderness palpation or guarding, no ascites or distention Attention normal, affect normal, judgment and insight appear normal  Data Reviewed: Case discussed with vascular surgery Ultrasound requested from outside facility, reviewed and shows DVT of the left lower extremity from the common femoral vein down to the distal ends Basic metabolic panel and CBC reviewed normal      Disposition: Status is:  Observation The patient was admitted with a DVT, vascular surgery was consulted and recommended evaluation for possible intervention.  He will continue on heparin and may need to be converted to inpatient if he needs procedure this evening.        Author: , MD 06/29/2022 4:58 PM  For on call review [email or web address].

## 2022-06-29 NOTE — Discharge Summary (Signed)
Physician Discharge Summary   Patient: Clifford Roberts MRN: [id number] DOB: [birth year]  Admit date:     06/28/2022  Discharge date: 06/29/22  Discharge Physician: Alberteen Sam   PCP: Gwenyth Bender, MD     Recommendations at discharge:  Follow up with PCP     Discharge Diagnoses: Principal Problem:   DVT (deep venous thrombosis) (HCC) Active Problems:   Essential hypertension   Ulcerative colitis [facility])      Hospital Course: Clifford Roberts is a 66 y.o. M with HTN, ulcerative colitis on Humira who presented for LE DVT.  Evidently had injury to leg a few weeks ago, developed severe swelling, got [place] that showed "extensive DVT".  PCP asked for admission for vascular surgery consultation.   * DVT (deep venous thrombosis) (HCC) Patient admitted on heparin drip.  Vascular surgery evaluated patient who did not need vascular surgery intervention. Patient transitioned to Eliquis and discharged.   Ulcerative colitis (HCC) On  Humira. Follows with Dr.  Elnoria Howard  Essential hypertension Controlled.             Consultants: Vascular surgery Procedures performed: None  Disposition: Home    DISCHARGE MEDICATION: Allergies as of 06/29/2022       Reactions   Prochlorperazine Other (See Comments)   Causes Stroke Symptoms        Medication List     TAKE these medications    Apixaban Starter Pack (10mg  and 5mg ) Commonly known as: ELIQUIS STARTER PACK Take as directed on package: start with two-5mg  tablets twice daily for 7 days. On day 8, switch to one-5mg  tablet twice daily.   cholecalciferol 25 MCG (1000 UNIT) tablet Commonly known as: VITAMIN D3 Take 1,000 Units by mouth daily.   Humira 40 MG/0.4ML Pskt Generic drug: Adalimumab Inject 40 mg into the skin every 14 (fourteen) days.   mesalamine 1.2 g EC tablet Commonly known as: LIALDA Take 2.4 g by mouth in the morning and at bedtime. Take 2 tablets (2.2 mg) BID   Nebivolol HCl 20 MG Tabs Take 1 tablet by  mouth daily.   predniSONE 10 MG tablet Commonly known as: DELTASONE Take 40 mg by mouth daily as needed (colitis).   spironolactone 25 MG tablet Commonly known as: ALDACTONE Take 25 mg by mouth 2 (two) times daily.   TURMERIC PO Take 1 capsule by mouth daily.   vitamin E 1000 UNIT capsule Take 1,000 Units by mouth daily.           Discharge Exam: Filed Weights   06/28/22 1948  Weight: 93.7 kg    General:  Pt is alert, awake, not in acute distress Cardiovascular: RRR, nl S1-S2, no murmurs appreciated.   Mild nonpitting LE edema bilaterally, L slightly > R.  No pain, no redness.   Respiratory: Normal respiratory rate and rhythm.  CTAB without rales or wheezes. Abdominal: Abdomen soft and non-tender.  No distension or HSM.   Neuro/Psych: Strength symmetric in upper and lower extremities.  Judgment and insight appear normal.   Condition at discharge: good  The results of significant diagnostics from this hospitalization (including imaging, microbiology, ancillary and laboratory) are listed below for reference.   Imaging Studies: No results found.  Microbiology: No results found for this or any previous visit.  Labs: CBC: Recent Labs  Lab 06/28/22 1947 06/29/22 0628  WBC 5.3 4.7  HGB 12.9* 13.1  HCT 38.4* 38.6*  MCV 91.9 92.3  PLT 167 157   Basic Metabolic Panel: Recent Labs  Lab 06/28/22 1947  NA 138  K 3.8  CL 105  CO2 24  GLUCOSE 96  BUN 8  CREATININE 1.06  CALCIUM 8.9   Liver Function Tests: No results for input(s): "AST", "ALT", "ALKPHOS", "BILITOT", "PROT", "ALBUMIN" in the last 168 hours. CBG: No results for input(s): "GLUCAP" in the last 168 hours.  Discharge time spent: approximately 35 minutes spent on discharge counseling, evaluation of patient on day of discharge, and coordination of discharge planning with nursing, social work, pharmacy and case management  Signed: Alberteen Sam, MD [facility] 06/29/2022

## 2022-06-29 NOTE — Assessment & Plan Note (Addendum)
[place] from common femoral vein to distal veins.  Discussed with Vascular surgery, may need intervention. - Continue heparin gtt - Consult Gen Surg

## 2022-06-29 NOTE — Consult Note (Addendum)
[facility] [place]  Patient name: Clifford Roberts MRN: [id number] DOB: January 23, 1956 Sex: male   REQUESTING PROVIDER:    Hospitalists   REASON FOR CONSULT:    Left leg DVT  HISTORY OF PRESENT ILLNESS:   Clifford Roberts is a 66 y.o. male, who was sent to the hospital by his PCP for a left leg DVT which involve the left common femoral vein, femoral vein popliteal vein and tibial veins.  On July 19, the patient states that he began having some discomfort in his left leg and felt a pop around his hamstring when he picked up his granddaughter.  About 2 to 3 weeks ago, he began having worsening issues and having some swelling which has persisted.  It has gotten better but has not resolved.  He denies any chest pain or shortness of breath.  He states that there is a family history of clotting, however he has not had any prior issues.  He was admitted for anticoagulation.  He is medically managed for hypertension and hyperlipidemia.    PAST MEDICAL HISTORY    Past Medical History:  Diagnosis Date   Allergy    Hyperlipidemia    Hypertension    Osteoarthrosis, unspecified whether generalized or localized, unspecified site      FAMILY HISTORY   No family history on file.  SOCIAL HISTORY:   Social History   Socioeconomic History   Marital status: Married    Spouse name: Not on file   Number of children: Not on file   Years of education: Not on file   Highest education level: Not on file  Occupational History   Not on file  Tobacco Use   Smoking status: Not on file   Smokeless tobacco: Not on file  Vaping Use   Vaping Use: Never used  Substance and Sexual Activity   Alcohol use: Not on file   Drug use: Not on file   Sexual activity: Not on file  Other Topics Concern   Not on file  Social History Narrative   Not on file   Social Determinants of Health   Financial Resource Strain: Not on file  Food Insecurity: Not  on file  Transportation Needs: Not on file  Physical Activity: Not on file  Stress: Not on file  Social Connections: Not on file  Intimate Partner Violence: Not on file    ALLERGIES:    Allergies  Allergen Reactions   Prochlorperazine Other (See Comments)    Causes Stroke Symptoms    CURRENT MEDICATIONS:    Current Facility-Administered Medications  Medication Dose Route Frequency Provider Last Rate Last Admin   apixaban (ELIQUIS) tablet 10 mg  10 mg Oral BID Edwin Dada, MD   10 mg at 06/29/22 1836   Followed by   Derrill Memo ON 07/06/2022] apixaban (ELIQUIS) tablet 5 mg  5 mg Oral BID Danford, Suann Larry, MD       cholecalciferol (VITAMIN D3) 25 MCG (1000 UNIT) tablet 1,000 Units  1,000 Units Oral Daily Tu, Ching T, DO   1,000 Units at 06/29/22 0913   mesalamine (LIALDA) EC tablet 2.4 g  2.4 g Oral BID Tu, Ching T, DO   2.4 g at 06/29/22 0914   nebivolol (BYSTOLIC) tablet 20 mg  20 mg Oral Daily Tu, Ching T, DO   20 mg at 06/29/22 [id number]   spironolactone (ALDACTONE) tablet 25 mg  25 mg Oral BID Tu, Ching T, DO   25 mg  at 06/29/22 0914   vitamin E capsule 1,000 Units  1,000 Units Oral Daily Tu, Ching T, DO   1,000 Units at 06/29/22 6237   Current Outpatient Medications  Medication Sig Dispense Refill   Adalimumab (HUMIRA) 40 MG/0.4ML PSKT Inject 40 mg into the skin every 14 (fourteen) days.     APIXABAN (ELIQUIS) VTE STARTER PACK (10MG  AND 5MG ) Take as directed on package: start with two-5mg  tablets twice daily for 7 days. On day 8, switch to one-5mg  tablet twice daily. 1 each 0   cholecalciferol (VITAMIN D3) 25 MCG (1000 UNIT) tablet Take 1,000 Units by mouth daily.     mesalamine (LIALDA) 1.2 g EC tablet Take 2.4 g by mouth in the morning and at bedtime. Take 2 tablets (2.2 mg) BID     Nebivolol HCl 20 MG TABS Take 1 tablet by mouth daily.     predniSONE (DELTASONE) 10 MG tablet Take 40 mg by mouth daily as needed (colitis).     spironolactone (ALDACTONE) 25 MG tablet  Take 25 mg by mouth 2 (two) times daily.     TURMERIC PO Take 1 capsule by mouth daily.     vitamin E 1000 UNIT capsule Take 1,000 Units by mouth daily.      REVIEW OF SYSTEMS:   [X]  denotes positive finding, [ ]  denotes negative finding Cardiac  Comments:  Chest pain or chest pressure:    Shortness of breath upon exertion:    Short of breath when lying flat:    Irregular heart rhythm:        Vascular    Pain in calf, thigh, or hip brought on by ambulation:    Pain in feet at night that wakes you up from your sleep:     Blood clot in your veins:    Leg swelling:  x       Pulmonary    Oxygen at home:    Productive cough:     Wheezing:         Neurologic    Sudden weakness in arms or legs:     Sudden numbness in arms or legs:     Sudden onset of difficulty speaking or slurred speech:    Temporary loss of vision in one eye:     Problems with dizziness:         Gastrointestinal    Blood in stool:      Vomited blood:         Genitourinary    Burning when urinating:     Blood in urine:        Psychiatric    Major depression:         Hematologic    Bleeding problems:    Problems with blood clotting too easily:        Skin    Rashes or ulcers:        Constitutional    Fever or chills:     PHYSICAL EXAM:   Vitals:   06/28/22 2227 06/29/22 0240 06/29/22 0638 06/29/22 1408  BP: 122/77 127/78 (!) 144/81 134/78  Pulse: 65 64 (!) 58 (!) 55  Resp: 15 19 16 16   Temp: 97.8 F (36.6 C) 97.9 F (36.6 C) 98 F (36.7 C) 98.1 F (36.7 C)  TempSrc: Oral Oral Oral Oral  SpO2: 100% 98% 100% 100%  Weight:      Height:        GENERAL: The patient is a well-nourished male, in no acute distress.  The vital signs are documented above. CARDIAC: There is a regular rate and rhythm.  VASCULAR: Left leg pitting edema PULMONARY: Nonlabored respirations MUSCULOSKELETAL: There are no major deformities or cyanosis. NEUROLOGIC: No focal weakness or paresthesias are  detected. SKIN: There are no ulcers or rashes noted. PSYCHIATRIC: The patient has a normal affect.  STUDIES:   I have reviewed his outside ultrasound report which shows a DVT in the left common femoral, femoral, popliteal, and tibial veins  ASSESSMENT and PLAN   Left leg DVT: I had a long conversation with the patient discussing our treatment options.  We could continue with anticoagulation or consider mechanical thrombectomy via a left popliteal approach.  I discussed that with anticoagulation alone, he would be at risk for post thrombotic issues.  His risk for this will be less with mechanical thrombectomy.  He would like to think about this but is leaning towards intervention.  From my perspective, he can be transition to a DOAC and discharge.  I have given him my personal cell phone and our office number.  He will contact us in the next couple days with his final decision.  Because of his family history of clotting disorders, he will ultimately need referral to hematology to determine the appropriate length of anticoagulation.   Charlena Cross, MD, FACS [facility] [phone] Pager [phone]

## 2022-06-29 NOTE — Progress Notes (Addendum)
ANTICOAGULATION CONSULT NOTE - Follow Up Consult  Pharmacy Consult for Heparin Indication: DVT  Allergies  Allergen Reactions   Prochlorperazine Other (See Comments)    Causes Stroke Symptoms    Patient Measurements: Height: 6' 0.5" (184.2 cm) Weight: 93.7 kg (206 lb 9.6 oz) IBW/kg (Calculated) : 78.75 Heparin Dosing Weight: TBW  Vital Signs: Temp: 98 F (36.7 C) (08/15 0638) Temp Source: Oral (08/15 9292) BP: 144/81 (08/15 4462) Pulse Rate: 58 (08/15 0638)  Labs: Recent Labs    06/28/22 1947 06/29/22 0628  HGB 12.9*  --   HCT 38.4*  --   PLT 167  --   HEPARINUNFRC  --  0.37  CREATININE 1.06  --     Estimated Creatinine Clearance: 74.3 mL/min (by C-G formula based on SCr of 1.06 mg/dL).   Medications:  Infusions:   heparin 1,500 Units/hr (06/28/22 2057)    Assessment: 66 yoM directly admitted on 8/14 after outpatient venous doppler showing +new DVT.  Pharmacy is consulted to dose Heparin.   No prior anticoagulation noted.   Today, 06/29/2022: Heparin level 0.37, therapeutic on heparin 1500 units/hr CBC: Hgb and Plt WNL No infusion interruptions, No bleeding or complications reported   Goal of Therapy:  Heparin level 0.3-0.7 units/ml Monitor platelets by anticoagulation protocol: Yes   Plan:  Continue heparin IV infusion at 1500 units/hr Confirmatory Heparin level in 6 hours  Daily heparin level and CBC Follow up long-term anticoagulation plans.    Lynann Beaver PharmD, BCPS Clinical Pharmacist [facility] main pharmacy [phone] 06/29/2022 7:26 AM   Addendum: Recheck HL decreased to 0.29, subtherapeutic on heparin  1500 units/hr No infusion interruptions, No bleeding or complications reported. Plan:  Give heparin 1500 units bolus IV x 1 Increase to heparin IV infusion at 1650 units/hr Heparin level 6 hours after rate change Daily heparin level and CBC Follow up plans for long-term oral anticoagulation after vascular consult.     Lynann Beaver  PharmD, BCPS Clinical Pharmacist [facility] main pharmacy [phone] 06/29/2022 1:44 PM

## 2022-06-30 ENCOUNTER — Other Ambulatory Visit (HOSPITAL_COMMUNITY): Payer: Self-pay

## 2022-06-30 ENCOUNTER — Other Ambulatory Visit: Payer: Self-pay | Admitting: Family Medicine

## 2022-06-30 MED ORDER — APIXABAN (ELIQUIS) VTE STARTER PACK (10MG AND 5MG)
ORAL_TABLET | ORAL | 0 refills | Status: DC
  Filled 2022-06-30: qty 74, 30d supply, fill #0

## 2022-06-30 NOTE — Progress Notes (Signed)
Sent Eliquis Rx to different pharmacy.  Spoke to patient.

## 2022-07-02 ENCOUNTER — Other Ambulatory Visit: Payer: Self-pay

## 2022-07-02 DIAGNOSIS — I824Y2 Acute embolism and thrombosis of unspecified deep veins of left proximal lower extremity: Secondary | ICD-10-CM

## 2022-07-06 ENCOUNTER — Ambulatory Visit (HOSPITAL_COMMUNITY)
Admission: RE | Admit: 2022-07-06 | Discharge: 2022-07-06 | Disposition: A | Discharge status: Home / Self Care | Payer: Medicare Other | Attending: Surgery | Admitting: Surgery

## 2022-07-06 ENCOUNTER — Encounter (HOSPITAL_COMMUNITY)
Admission: RE | Disposition: A | Discharge status: Home / Self Care | Payer: Self-pay | Source: Home / Self Care | Attending: Surgery

## 2022-07-06 ENCOUNTER — Other Ambulatory Visit: Payer: Self-pay

## 2022-07-06 DIAGNOSIS — I10 Essential (primary) hypertension: Secondary | ICD-10-CM | POA: Diagnosis not present

## 2022-07-06 DIAGNOSIS — I824Y2 Acute embolism and thrombosis of unspecified deep veins of left proximal lower extremity: Secondary | ICD-10-CM

## 2022-07-06 DIAGNOSIS — I82442 Acute embolism and thrombosis of left tibial vein: Secondary | ICD-10-CM

## 2022-07-06 DIAGNOSIS — I82402 Acute embolism and thrombosis of unspecified deep veins of left lower extremity: Secondary | ICD-10-CM | POA: Insufficient documentation

## 2022-07-06 DIAGNOSIS — I82422 Acute embolism and thrombosis of left iliac vein: Secondary | ICD-10-CM

## 2022-07-06 DIAGNOSIS — E785 Hyperlipidemia, unspecified: Secondary | ICD-10-CM | POA: Diagnosis not present

## 2022-07-06 DIAGNOSIS — I82412 Acute embolism and thrombosis of left femoral vein: Secondary | ICD-10-CM

## 2022-07-06 HISTORY — PX: PERIPHERAL VASCULAR THROMBECTOMY: CATH118306

## 2022-07-06 LAB — POCT I-STAT, CHEM 8
BUN: 4 mg/dL — ABNORMAL LOW (ref 8–23)
Calcium, Ion: 1.12 mmol/L — ABNORMAL LOW (ref 1.15–1.40)
Chloride: 105 mmol/L (ref 98–111)
Creatinine, Ser: 1 mg/dL (ref 0.61–1.24)
Glucose, Bld: 82 mg/dL (ref 70–99)
HCT: 38 % — ABNORMAL LOW (ref 39.0–52.0)
Hemoglobin: 12.9 g/dL — ABNORMAL LOW (ref 13.0–17.0)
Potassium: 3.6 mmol/L (ref 3.5–5.1)
Sodium: 140 mmol/L (ref 135–145)
TCO2: 25 mmol/L (ref 22–32)

## 2022-07-06 LAB — NO BLOOD PRODUCTS

## 2022-07-06 SURGERY — PERIPHERAL VASCULAR THROMBECTOMY
Anesthesia: LOCAL

## 2022-07-06 MED ORDER — FENTANYL CITRATE (PF) 100 MCG/2ML IJ SOLN
INTRAMUSCULAR | Status: DC | PRN
Start: 2022-07-06 — End: 2022-07-06
  Administered 2022-07-06: 25 ug via INTRAVENOUS
  Administered 2022-07-06: 50 ug via INTRAVENOUS

## 2022-07-06 MED ORDER — LIDOCAINE HCL (PF) 1 % IJ SOLN
INTRAMUSCULAR | Status: AC
Start: 2022-07-06 — End: ?
  Filled 2022-07-06: qty 30

## 2022-07-06 MED ORDER — MIDAZOLAM HCL 2 MG/2ML IJ SOLN
INTRAMUSCULAR | Status: AC
  Filled 2022-07-06: qty 2

## 2022-07-06 MED ORDER — FENTANYL CITRATE (PF) 100 MCG/2ML IJ SOLN
INTRAMUSCULAR | Status: AC
  Filled 2022-07-06: qty 2

## 2022-07-06 MED ORDER — APIXABAN 5 MG PO TABS: 10.0000 mg | ORAL_TABLET | Freq: Once | ORAL | Status: DC

## 2022-07-06 MED ORDER — HEPARIN SODIUM (PORCINE) 1000 UNIT/ML IJ SOLN
INTRAMUSCULAR | Status: DC | PRN
  Administered 2022-07-06: 9000 [IU] via INTRAVENOUS

## 2022-07-06 MED ORDER — MIDAZOLAM HCL 2 MG/2ML IJ SOLN
INTRAMUSCULAR | Status: DC | PRN
  Administered 2022-07-06: 1 mg via INTRAVENOUS
  Administered 2022-07-06: 2 mg via INTRAVENOUS

## 2022-07-06 MED ORDER — HEPARIN (PORCINE) IN NACL 1000-0.9 UT/500ML-% IV SOLN
INTRAVENOUS | Status: AC
  Filled 2022-07-06: qty 500

## 2022-07-06 MED ORDER — HEPARIN (PORCINE) IN NACL 1000-0.9 UT/500ML-% IV SOLN
INTRAVENOUS | Status: DC | PRN
  Administered 2022-07-06: 500 mL

## 2022-07-06 MED ORDER — LIDOCAINE HCL (PF) 1 % IJ SOLN
INTRAMUSCULAR | Status: DC | PRN
  Administered 2022-07-06: 20 mL via INTRADERMAL

## 2022-07-06 MED ORDER — HEPARIN SODIUM (PORCINE) 1000 UNIT/ML IJ SOLN
INTRAMUSCULAR | Status: AC
Start: 2022-07-06 — End: ?
  Filled 2022-07-06: qty 10

## 2022-07-06 MED ORDER — SODIUM CHLORIDE 0.9 % IV SOLN: INTRAVENOUS | Status: DC

## 2022-07-06 SURGICAL SUPPLY — 15 items
BALLN MUSTANG 10X80X75 (BALLOONS) ×1
BALLN MUSTANG 12X60X135 (BALLOONS) ×1
BALLOON MUSTANG 10X80X75 (BALLOONS) IMPLANT
BALLOON MUSTANG 12X60X135 (BALLOONS) IMPLANT
CATH CLOT TRIEVER BOLD (CATHETERS) IMPLANT
CATH VISIONS PV .035 IVUS (CATHETERS) IMPLANT
GLIDEWIRE ADV .035X260CM (WIRE) IMPLANT
GLIDEWIRE NITREX 0.018X80X5 (WIRE) ×1
GUIDEWIRE NITREX 0.018X80X5 (WIRE) IMPLANT
KIT ENCORE 26 ADVANTAGE (KITS) IMPLANT
KIT MICROPUNCTURE NIT STIFF (SHEATH) IMPLANT
SHEATH CLOT RETRIEVER (SHEATH) IMPLANT
SHEATH PINNACLE 8F 10CM (SHEATH) IMPLANT
SHEATH PROBE COVER 6X72 (BAG) IMPLANT
TRAY PV CATH (CUSTOM PROCEDURE TRAY) IMPLANT

## 2022-07-06 NOTE — Progress Notes (Signed)
Per Dr. Myra Gianotti patient should bring compression hose to office. Patient should leave compression dressing on leg to be removed at office. Patient will call to confirm if appt tomorrow or Thursday.

## 2022-07-06 NOTE — Discharge Instructions (Signed)
Give dose of Eliquis now and take again tonight as scheduled

## 2022-07-06 NOTE — Op Note (Signed)
Patient name: Clifford Roberts MRN: [id number] DOB: 1956-08-12 Sex: male  07/06/2022 Pre-operative Diagnosis: Left leg DVT Post-operative diagnosis:  Same Surgeon:  Durene Cal Procedure Performed:  1.  Ultrasound-guided access, left popliteal vein  2.  Intravascular ultrasound: Left popliteal, femoral, common femoral, external iliac, common iliac vein, and IVC  3.  Mechanical thrombectomy using the INARI device of the external iliac, left common femoral, femoral, and popliteal vein  4.  Balloon venoplasty of the left common iliac, external iliac, common femoral, femoral, and popliteal vein  5.  Left leg venogram and IVC venogram  6.  Conscious sedation, 55 minutes     Indications: This is a 66 year old gentleman who was hospitalized last week with a left leg DVT extending into the common femoral vein.  He was discharged on anticoagulation but is having persistent swelling and therefore intervention was recommended.  Procedure:  The patient was identified in the holding area and taken to room 8.  The patient was then placed supine on the table and prepped and draped in the usual sterile fashion.  A time out was called.  Conscious sedation was administered with the use of IV fentanyl and Versed under continuous physician and nurse monitoring.  Heart rate, blood pressure, and oxygen saturation were continuously monitored.  Total sedation time was 55 minutes.  Ultrasound was used to evaluate the left popliteal vein which was filled with thrombus.  1% lidocaine was used for local anesthesia.  The left popliteal vein was then cannulated under ultrasound guidance with a micropuncture needle.  A [place] wire was inserted followed by placement of micropuncture sheath.  A contrast injection was performed confirming that I was in the vein.  I then advanced a Glidewire advantage into the left subclavian vein and a 8 French sheath was placed.  I then evaluated the venous system with IVUS.  I visualize  thrombus within the left popliteal, femoral, common femoral, and distal external iliac vein.  There was rouleaux flow within the proximal external iliac and common iliac vein.  There did appear to be some narrowing within the external iliac vein from external compression.  The patient was fully heparinized.  I then inserted the INARI sheath.  I performed mechanical thrombectomy of the left external iliac, common femoral, femoral, and popliteal vein.  4 passes were performed rotating the device into different quadrants.  Mostly chronic thrombus was evacuated.  I then reevaluated everything with IVUS.  I then performed balloon venoplasty using a 10 x 80 balloon.  The external iliac, common femoral, femoral, and popliteal vein were treated.  IVUS was then used to interrogate the system.  No residual thrombus was visualized.  There did appear to be some narrowing within the external iliac vein.  I then performed venography which showed inline flow into the IVC with no residual thrombus.  There did appear to be some residual narrowing within the external iliac vein and so I postdilated this with a 12 mm Mustang balloon.  Repeat venogram showed residual narrowing however contrast did not hanging up.  At this point, I did not want to place a stent.  I elected to terminate the procedure at this time.  The sheath was removed and a bolstered 3-0 nylon suture was used to close the cannulation site.  There were no immediate complications.   #1 successful mechanical venous thrombectomy of the left popliteal, femoral, common femoral, and distal external iliac vein with subsequent balloon venoplasty.  There was residual narrowing within  the external iliac vein that was treated with a 12 mm balloon.  No contrast hung up and there was good flow through this area and so I elected not to stent it.  #2  The patient will return to clinic tomorrow to have the suture removed.  We will then place him into compression socks which she  will receive today at the hospital.    V. Durene Cal, M.D., [facility] [place] Office: [phone] Pager:  [phone]

## 2022-07-06 NOTE — Interval H&P Note (Signed)
History and Physical Interval Note:  07/06/2022 10:23 AM  Candida Peeling  has presented today for surgery, with the diagnosis of dvt.  The various methods of treatment have been discussed with the patient and family. After consideration of risks, benefits and other options for treatment, the patient has consented to  Procedure(s): MECHANICAL VENOUS THROMBECTOMY (N/A) as a surgical intervention.  The patient's history has been reviewed, patient examined, no change in status, stable for surgery.  I have reviewed the patient's chart and labs.  Questions were answered to the patient's satisfaction.     Clifford Roberts

## 2022-07-07 ENCOUNTER — Encounter (HOSPITAL_COMMUNITY): Payer: Self-pay | Admitting: Surgery

## 2022-07-07 ENCOUNTER — Ambulatory Visit (INDEPENDENT_AMBULATORY_CARE_PROVIDER_SITE_OTHER): Payer: Medicare Other | Admitting: Physician Assistant

## 2022-07-07 VITALS — BP 140/77 | HR 62 | Temp 98.2°F | Ht 72.25 in | Wt 207.0 lb

## 2022-07-07 DIAGNOSIS — I824Y2 Acute embolism and thrombosis of unspecified deep veins of left proximal lower extremity: Secondary | ICD-10-CM

## 2022-07-08 ENCOUNTER — Encounter: Payer: Self-pay | Admitting: Physician Assistant

## 2022-07-08 NOTE — Progress Notes (Signed)
Office Note     CC:  follow up Requesting Provider:  Gwenyth Bender, MD  HPI: Clifford Roberts is a 66 y.o. (24-Mar-1956) male who presents status post mechanical thrombectomy of the left external iliac, common femoral, femoral, and popliteal vein with balloon angioplasty of the iliac system by Dr. Myra Gianotti on 07/06/2022 due to extensive DVT.  Dressing that was applied after the procedure was left in place.  He has not noticed any change in how his leg feels since surgery.  He is on Eliquis.  He was fitted for thigh-high 20 to 30 mm compression sock in the hospital however did not bring that with him today.   Past Medical History:  Diagnosis Date   Allergy    Hyperlipidemia    Hypertension    Osteoarthrosis, unspecified whether generalized or localized, unspecified site     Past Surgical History:  Procedure Laterality Date   PERIPHERAL VASCULAR THROMBECTOMY N/A 07/06/2022   Procedure: MECHANICAL VENOUS THROMBECTOMY;  Surgeon: Nada Libman, MD;  Location: [facility] INVASIVE [facility];  Service: Cardiovascular;  Laterality: N/A;    Social History   Socioeconomic History   Marital status: Married    Spouse name: Not on file   Number of children: Not on file   Years of education: Not on file   Highest education level: Not on file  Occupational History   Not on file  Tobacco Use   Smoking status: Never   Smokeless tobacco: Never  Vaping Use   Vaping Use: Never used  Substance and Sexual Activity   Alcohol use: Not on file   Drug use: Not on file   Sexual activity: Not on file  Other Topics Concern   Not on file  Social History Narrative   Not on file   Social Determinants of Health   Financial Resource Strain: Not on file  Food Insecurity: Not on file  Transportation Needs: Not on file  Physical Activity: Not on file  Stress: Not on file  Social Connections: Not on file  Intimate Partner Violence: Not on file   History reviewed. No pertinent family history.  Current  Outpatient Medications  Medication Sig Dispense Refill   Adalimumab (HUMIRA) 40 MG/0.4ML PSKT Inject 40 mg into the skin every 14 (fourteen) days.     APIXABAN (ELIQUIS) VTE STARTER PACK (10MG  AND 5MG ) Take as directed on package: start with two-5mg  tablets twice daily for 7 days. On day 8, switch to one-5mg  tablet twice daily. 74 each 0   cholecalciferol (VITAMIN D3) 25 MCG (1000 UNIT) tablet Take 1,000 Units by mouth daily.     mesalamine (LIALDA) 1.2 g EC tablet Take 2.4 g by mouth in the morning and at bedtime. Take 2 tablets (2.2 mg) BID     Nebivolol HCl 20 MG TABS Take 1 tablet by mouth daily.     predniSONE (DELTASONE) 10 MG tablet Take 40 mg by mouth daily as needed (colitis).     spironolactone (ALDACTONE) 25 MG tablet Take 25 mg by mouth 2 (two) times daily.     TURMERIC PO Take 1 capsule by mouth daily.     vitamin E 1000 UNIT capsule Take 1,000 Units by mouth daily.     No current facility-administered medications for this visit.    Allergies  Allergen Reactions   Prochlorperazine Other (See Comments)    Causes Stroke Symptoms     REVIEW OF SYSTEMS:   [X]  denotes positive finding, [ ]  denotes negative finding Cardiac  Comments:  Chest pain or chest pressure:    Shortness of breath upon exertion:    Short of breath when lying flat:    Irregular heart rhythm:        Vascular    Pain in calf, thigh, or hip brought on by ambulation:    Pain in feet at night that wakes you up from your sleep:     Blood clot in your veins:    Leg swelling:         Pulmonary    Oxygen at home:    Productive cough:     Wheezing:         Neurologic    Sudden weakness in arms or legs:     Sudden numbness in arms or legs:     Sudden onset of difficulty speaking or slurred speech:    Temporary loss of vision in one eye:     Problems with dizziness:         Gastrointestinal    Blood in stool:     Vomited blood:         Genitourinary    Burning when urinating:     Blood in urine:         Psychiatric    Major depression:         Hematologic    Bleeding problems:    Problems with blood clotting too easily:        Skin    Rashes or ulcers:        Constitutional    Fever or chills:      PHYSICAL EXAMINATION:  Vitals:   07/07/22 1505  BP: (!) 140/77  Pulse: 62  Temp: 98.2 F (36.8 C)  TempSrc: Temporal  SpO2: 99%  Weight: 207 lb (93.9 kg)  Height: 6' 0.25" (1.835 m)    General:  WDWN in NAD; vital signs documented above Gait: Not observed HENT: WNL, normocephalic Pulmonary: normal non-labored breathing Cardiac: regular HR Abdomen: soft, NT, no masses Skin: without rashes Vascular Exam/Pulses:  Right Left  Radial 2+ (normal) 2+ (normal)  DP 2+ (normal) 2+ (normal)   Extremities: no venous ulceration; LLE edema compared to R Musculoskeletal: no muscle wasting or atrophy  Neurologic: A&O X 3;  No focal weakness or paresthesias are detected Psychiatric:  The pt has Normal affect.   ASSESSMENT/PLAN:: 66 y.o. male status post mechanical thrombectomy of left lower extremity including iliac veins as well as balloon angioplasty of the iliac veins due to DVT  -Left leg dressing was taken down to remove popliteal vein suture.  Leg was then rewrapped to the level of the thigh.  He will continue left leg wrap until this evening.  Tomorrow morning he was instructed to wear his thigh-high compression stocking which should be worn daily for the next several weeks.  He will continue his DOAC.  He is scheduled for IVC iliac and left lower extremity venous duplex in 1 month.   Emilie Rutter, PA-C [facility] [phone]  Clinic MD:   Randie Heinz

## 2022-07-13 ENCOUNTER — Other Ambulatory Visit: Payer: Self-pay

## 2022-07-13 DIAGNOSIS — I824Y2 Acute embolism and thrombosis of unspecified deep veins of left proximal lower extremity: Secondary | ICD-10-CM

## 2022-08-09 ENCOUNTER — Encounter: Payer: Self-pay | Admitting: Surgery

## 2022-08-09 ENCOUNTER — Ambulatory Visit (INDEPENDENT_AMBULATORY_CARE_PROVIDER_SITE_OTHER)
Admission: RE | Admit: 2022-08-09 | Discharge: 2022-08-09 | Disposition: A | Discharge status: Home / Self Care | Payer: Medicare Other | Source: Ambulatory Visit | Attending: Surgery | Admitting: Surgery

## 2022-08-09 ENCOUNTER — Ambulatory Visit (INDEPENDENT_AMBULATORY_CARE_PROVIDER_SITE_OTHER): Payer: Medicare Other | Admitting: Surgery

## 2022-08-09 ENCOUNTER — Telehealth: Payer: Self-pay | Admitting: Physician Assistant

## 2022-08-09 ENCOUNTER — Ambulatory Visit (HOSPITAL_COMMUNITY)
Admission: RE | Admit: 2022-08-09 | Discharge: 2022-08-09 | Disposition: A | Discharge status: Home / Self Care | Payer: Medicare Other | Source: Ambulatory Visit | Attending: Surgery | Admitting: Surgery

## 2022-08-09 VITALS — BP 153/79 | HR 57 | Temp 97.9°F | Resp 20 | Ht 72.0 in | Wt 207.8 lb

## 2022-08-09 DIAGNOSIS — I824Y2 Acute embolism and thrombosis of unspecified deep veins of left proximal lower extremity: Secondary | ICD-10-CM | POA: Insufficient documentation

## 2022-08-09 NOTE — Telephone Encounter (Signed)
Scheduled appt per 9/25 referral. Pt is aware of appt date and time. Pt is aware to arrive 15 mins prior to appt time and to bring and updated insurance card. Pt is aware of appt location.   

## 2022-08-09 NOTE — Progress Notes (Signed)
[facility] [phone]   [phone]  INITIAL CONSULT NOTE  Patient Care Team: Gwenyth Bender, MD as PCP - General (Internal Medicine)  Hematological/Oncological History -06/28/2022: Presented with worsening left lower extremity edema.  Doppler ultrasound revealed left leg DVT involving the left common femoral vein, femoral vein, popliteal vein and tibial veins.  He was discharged on Eliquis therapy.  -07/06/2022:He underwent mechanical thrombectomy of the external iliac, left common femoral, femoral and popliteal vein with balloon venoplasty.  -08/10/2022: Establish care with [facility]     CHIEF COMPLAINTS/PURPOSE OF CONSULTATION:  "Unprovoked LE DVT "  HISTORY OF PRESENTING ILLNESS:  Clifford Roberts 66 y.o. male with medical history significant for hyperlipidemia, hypertension, osteoarthritis and ulcerative colitis. He presents to the hematology clinic for recently diagnosed left lower extremity DVT. He is unaccompanied for this visit.   On exam today, Clifford Roberts reports that he is tolerating Eliquis without any side effects including bruising or bleeding. His swelling in the left leg has improved but is still there. He is able to ambulate without any limitations. His energy levels are at baseline and he can complete all his daily ADLs on his own. He exercises regularly and walks on the track routinely. He denies any nausea, vomiting, diarrhea or constipation. He denies fevers, chills, sweats, shortness of breath, chest pain or cough. He has no other complaints. Rest of the 10 point ROS is below.   MEDICAL HISTORY:  Past Medical History:  Diagnosis Date   Allergy    Hyperlipidemia    Hypertension    Osteoarthrosis, unspecified whether generalized or localized, unspecified site    Ulcerative colitis (HCC)     SURGICAL HISTORY: Past Surgical History:  Procedure Laterality Date   ARTHROSCOPIC REPAIR ACL     PERIPHERAL VASCULAR THROMBECTOMY N/A  07/06/2022   Procedure: MECHANICAL VENOUS THROMBECTOMY;  Surgeon: Nada Libman, MD;  Location: [facility];  Service: Cardiovascular;  Laterality: N/A;    SOCIAL HISTORY: Social History   Socioeconomic History   Marital status: Married    Spouse name: Not on file   Number of children: Not on file   Years of education: Not on file   Highest education level: Not on file  Occupational History   Not on file  Tobacco Use   Smoking status: Never   Smokeless tobacco: Never  Vaping Use   Vaping Use: Never used  Substance and Sexual Activity   Alcohol use: Yes    Alcohol/week: 7.0 standard drinks of alcohol    Types: 7 Cans of beer per week   Drug use: Yes    Types: Marijuana   Sexual activity: Not on file  Other Topics Concern   Not on file  Social History Narrative   Not on file   Social Determinants of Health   Financial Resource Strain: Not on file  Food Insecurity: Not on file  Transportation Needs: Not on file  Physical Activity: Not on file  Stress: Not on file  Social Connections: Not on file  Intimate Partner Violence: Not on file    FAMILY HISTORY: Family History  Problem Relation Age of Onset   Brain cancer Mother    Cancer - Lung Father    Deep vein thrombosis Daughter    Pulmonary embolism Daughter     ALLERGIES:  is allergic to prochlorperazine.  MEDICATIONS:  Current Outpatient Medications  Medication Sig Dispense Refill   Adalimumab (HUMIRA) 40 MG/0.4ML PSKT Inject 40 mg into  the skin every 14 (fourteen) days.     apixaban (ELIQUIS) 5 MG TABS tablet Take 5 mg by mouth 2 (two) times daily.     cholecalciferol (VITAMIN D3) 25 MCG (1000 UNIT) tablet Take 1,000 Units by mouth daily.     mesalamine (LIALDA) 1.2 g EC tablet Take 2.4 g by mouth in the morning and at bedtime. Take 2 tablets (2.2 mg) BID     Nebivolol HCl 20 MG TABS Take 1 tablet by mouth daily.     spironolactone (ALDACTONE) 25 MG tablet Take 25 mg by mouth 2 (two) times daily.      TURMERIC PO Take 1 capsule by mouth daily.     vitamin E 1000 UNIT capsule Take 1,000 Units by mouth daily.     APIXABAN (ELIQUIS) VTE STARTER PACK (10MG  AND 5MG ) Take as directed on package: start with two-5mg  tablets twice daily for 7 days. On day 8, switch to one-5mg  tablet twice daily. (Patient not taking: Reported on 08/10/2022) 74 each 0   predniSONE (DELTASONE) 10 MG tablet Take 40 mg by mouth daily as needed (colitis). (Patient not taking: Reported on 08/10/2022)     No current facility-administered medications for this visit.    REVIEW OF SYSTEMS:   Constitutional: ( - ) fevers, ( - )  chills , ( - ) night sweats Eyes: ( - ) blurriness of vision, ( - ) double vision, ( - ) watery eyes Ears, nose, mouth, throat, and face: ( - ) mucositis, ( - ) sore throat Respiratory: ( - ) cough, ( - ) dyspnea, ( - ) wheezes Cardiovascular: ( - ) palpitation, ( - ) chest discomfort, ( - ) lower extremity swelling Gastrointestinal:  ( - ) nausea, ( - ) heartburn, ( - ) change in bowel habits Skin: ( - ) abnormal skin rashes Lymphatics: ( - ) new lymphadenopathy, ( - ) easy bruising Neurological: ( - ) numbness, ( - ) tingling, ( - ) new weaknesses Behavioral/Psych: ( - ) mood change, ( - ) new changes  All other systems were reviewed with the patient and are negative.  PHYSICAL EXAMINATION: ECOG PERFORMANCE STATUS: 1 - Symptomatic but completely ambulatory  Vitals:   08/10/22 1355  BP: 128/72  Pulse: 69  Resp: 14  Temp: (!) 97.3 F (36.3 C)  SpO2: 99%   Filed Weights   08/10/22 1355  Weight: 210 lb 14.4 oz (95.7 kg)    GENERAL: well appearing male in NAD  SKIN: skin color, texture, turgor are normal, no rashes or significant lesions EYES: conjunctiva are pink and non-injected, sclera clear OROPHARYNX: no exudate, no erythema; lips, buccal mucosa, and tongue normal  NECK: supple, non-tender LYMPH:  no palpable lymphadenopathy in the cervical or supraclavicular lymph nodes.   LUNGS: clear to auscultation and percussion with normal breathing effort HEART: regular rate & rhythm and no murmurs. Left lower extremity edema.  ABDOMEN: soft, non-tender, non-distended, normal bowel sounds Musculoskeletal: no cyanosis of digits and no clubbing  PSYCH: alert & oriented x 3, fluent speech NEURO: no focal motor/sensory deficits  LABORATORY DATA:  I have reviewed the data as listed    Latest Ref Rng & Units 07/06/2022    9:25 AM 06/29/2022    6:28 AM 06/28/2022    7:47 PM  CBC  WBC 4.0 - 10.5 K/uL  4.7  5.3   Hemoglobin 13.0 - 17.0 g/dL 12.9  13.1  12.9   Hematocrit 39.0 - 52.0 % 38.0  38.6  38.4   Platelets 150 - 400 K/uL  157  167        Latest Ref Rng & Units 07/06/2022    9:25 AM 06/28/2022    7:47 PM  CMP  Glucose 70 - 99 mg/dL 82  96   BUN 8 - 23 mg/dL 4  8   Creatinine 1.610.61 - 1.24 mg/dL 0.961.00  0.451.06   Sodium 409135 - 145 mmol/L 140  138   Potassium 3.5 - 5.1 mmol/L 3.6  3.8   Chloride 98 - 111 mmol/L 105  105   CO2 22 - 32 mmol/L  24   Calcium 8.9 - 10.3 mg/dL  8.9       RADIOGRAPHIC STUDIES: I have personally reviewed the radiological images as listed and agreed with the findings in the report. VAS US IVC/ILIAC (VENOUS ONLY)  Result Date: 08/09/2022 IVC/ILIAC STUDY Patient Name:  Candida PeelingJOSEPH A Dispenza  Date of Exam:   08/09/2022 Medical Rec #: [id number]      Accession #:    [id number] Date of Birth: April 20, 1956      Patient Gender: M Patient Age:   [age] years [facility]am [facility]  [facility] Procedure:      VAS US IVC/ILIAC (VENOUS ONLY) Referring Phys: Coral ElseVANCE BRABHAM --------------------------------------------------------------------------------  Indications: Iliocaval DVT Vascular Interventions: 07/06/2022                         Pre-operative Diagnosis: Left leg DVT                         Post-operative diagnosis: Same                         Surgeon: Durene CalWells Brabham                         Procedure Performed:                         1. Ultrasound-guided  access, left popliteal vein                         2. Intravascular ultrasound: Left popliteal, femoral,                         common femoral, external iliac, common iliac vein, and                         IVC                         3. Mechanical thrombectomy using the INARI device of the                         external iliac, left common femoral, femoral, and                         popliteal vein                         4. Balloon venoplasty of the left common iliac, external  iliac, common femoral, femoral, and popliteal vein                         5. Left leg venogram and IVC venogram                         6. Conscious sedation, 55 minutes.  Performing Technologist: Dorthula Matas RVS, RCS  Examination Guidelines: A complete evaluation includes B-mode imaging, spectral Doppler, color Doppler, and power Doppler as needed of all accessible portions of each vessel. Bilateral testing is considered an integral part of a complete examination. Limited examinations for reoccurring indications may be performed as noted.  IVC/Iliac Findings: +----------+------+--------+--------+    IVC    PatentThrombusComments +----------+------+--------+--------+ IVC Prox  patent                 +----------+------+--------+--------+ IVC Mid   patent                 +----------+------+--------+--------+ IVC Distalpatent                 +----------+------+--------+--------+  +-------------------+---------+-----------+---------+-----------+--------+         CIV        RT-PatentRT-ThrombusLT-PatentLT-ThrombusComments +-------------------+---------+-----------+---------+-----------+--------+ Common Iliac Prox                       patent                      +-------------------+---------+-----------+---------+-----------+--------+ Common Iliac Mid                        patent                       +-------------------+---------+-----------+---------+-----------+--------+ Common Iliac Distal                     patent                      +-------------------+---------+-----------+---------+-----------+--------+  +-------------------------+---------+-----------+---------+-----------+--------+            EIV           RT-PatentRT-ThrombusLT-PatentLT-ThrombusComments +-------------------------+---------+-----------+---------+-----------+--------+ External Iliac Vein Prox                      patent                      +-------------------------+---------+-----------+---------+-----------+--------+ External Iliac Vein Mid                       patent                      +-------------------------+---------+-----------+---------+-----------+--------+ External Iliac Vein                           patent                      Distal                                                                    +-------------------------+---------+-----------+---------+-----------+--------+  Summary: IVC/Iliac: No evidence of thrombus in IVC and left Iliac veins.  *See table(s) above for measurements and observations.  Electronically signed by Coral Else MD on 08/09/2022 at 11:30:15 AM.    Final    VAS [place] LOWER EXTREMITY VENOUS (DVT)  Result Date: 08/09/2022  Lower Venous DVT Study Patient Name:  ZAUL HUBERS  Date of Exam:   08/09/2022 Medical Rec #: [id number]      Accession #:    [id number] Date of Birth: 1956/09/01      Patient Gender: M Patient Age:   39 years Exam Location:  [facility] Procedure:      VAS [place] LOWER EXTREMITY VENOUS (DVT) Referring Phys: Coral Else --------------------------------------------------------------------------------  Indications: Thrombectomy follow up.  Risk Factors: DVT left. Comparison Study: 07/06/2022                   Pre-operative Diagnosis: Left leg DVT                   Post-operative diagnosis: Same                    Surgeon: Durene Cal                   Procedure Performed:                   1. Ultrasound-guided access, left popliteal vein                   2. Intravascular ultrasound: Left popliteal, femoral, common                   femoral, external iliac, common iliac vein, and IVC                   3. Mechanical thrombectomy using the INARI device of the                   external iliac, left common femoral, femoral, and popliteal                   vein                   4. Balloon venoplasty of the left common iliac, external                   iliac, common femoral, femoral, and popliteal vein                   5. Left leg venogram and IVC venogram                   6. Conscious sedation, 55 minutes Performing Technologist: Dorthula Matas RVS, RCS  Examination Guidelines: A complete evaluation includes B-mode imaging, spectral Doppler, color Doppler, and power Doppler as needed of all accessible portions of each vessel. Bilateral testing is considered an integral part of a complete examination. Limited examinations for reoccurring indications may be performed as noted. The reflux portion of the exam is performed with the patient in reverse Trendelenburg.  +---------+---------------+---------+-----------+----------+--------------+ LEFT     CompressibilityPhasicitySpontaneityPropertiesThrombus Aging +---------+---------------+---------+-----------+----------+--------------+ CFV      Full                                                        +---------+---------------+---------+-----------+----------+--------------+  SFJ      Full                                                        +---------+---------------+---------+-----------+----------+--------------+ FV Prox  Full                                                        +---------+---------------+---------+-----------+----------+--------------+ FV Mid   Full                                                         +---------+---------------+---------+-----------+----------+--------------+ FV DistalFull                                                        +---------+---------------+---------+-----------+----------+--------------+ POP      Full                                                        +---------+---------------+---------+-----------+----------+--------------+ GSV      Full                                                        +---------+---------------+---------+-----------+----------+--------------+ SSV      Full                                                        +---------+---------------+---------+-----------+----------+--------------+   Left Technical Findings: Not visualized segments include posterior tibial and peroneal veins.   Summary: LEFT: - There is no evidence of deep vein thrombosis in the lower extremity. - There is no evidence of superficial venous thrombosis. - The posterior tibial and peroneal veins appear compressible but visualization is technically difficult.  *See table(s) above for measurements and observations. Electronically signed by Coral Else MD on 08/09/2022 at 11:29:51 AM.    Final     ASSESSMENT & PLAN LJ MIYAMOTO is a 66 y.o. male who presents to the clinic for evlauation for recently diagnosed left lower extremity DVT. He is currently on Eliquis 5 mg twice daily without any toxicities. We discussed possible causes that can provoke venous thromboembolisms (VTEs) including prolonged travel/immobility, surgery (particular abdominal or orthropedic), trauma,  and pregnancy/ estrogen containing birth control. After a detailed history and review of the records there is no clear provoking factor for this patient's VTE.  Patients with unprovoked VTEs  have up to 25% recurrence after 5 years and 36% at 10 years, with 4% of these clots being fatal ([phone]). Therefore the formal recommendation for unprovoked VTE's is lifelong anticoagulation,  as the cause may not be transient or reversible. We recommend 6 months or full strength anticoagulation with a re-evaluation after that time.  The patient's will then have a choice of maintenance dose DOAC (preferred, recommended), 81mg  ASA PO daily (non-preferred), or no further anticoagulation (not recommended).   #Unprovoked DVT --findings at this time are consistent with a unprovoked VTE --will order baseline CMP and CBC to assure labs are adequate for DOAC therapy --recommend indefinite anticoagulation. He can continue eliquis 5mg  BID. --patient denies any bleeding, bruising, or dark stools on this medication. It is well tolerated. No difficulties accessing/affording the medication --patient has family history of VTEs including his daughter and family members on his father's side. Will check factor 5 Leiden and prothrombin gene mutations.  --RTC in 3 months' time with strict return precautions for overt signs of bleeding.    No orders of the defined types were placed in this encounter.   All questions were answered. The patient knows to call the clinic with any problems, questions or concerns.  I have spent a total of 60 minutes minutes of face-to-face and non-face-to-face time, preparing to see the patient, obtaining and/or reviewing separately obtained history, performing a medically appropriate examination, counseling and educating the patient, ordering tests/procedures, documenting clinical information in the electronic health record, and care coordination.   , PA-C Department of Hematology/Oncology [facility] Phone: [phone]  Patient was [facility] with [facility]. [facility]  I have read the above note and personally examined the patient. I agree with the assessment and plan as noted above.  Briefly Mr. Divonte Senger is a 66 year old male who presents for evaluation of an unprovoked lower extremity DVT.  The patient reports that there were no  clear inciting factors such as prolonged travel via air or car, surgery, illness, or prolonged immobility.  He reports that he is tolerating anticoagulation therapy well with no bleeding, bruising, or dark stools.  The cost of the Eliquis is not inhibitive at this time.  He otherwise denies any signs or symptoms of persistent or recurrent VTE.  We will plan to see him back at the 29-month mark in order to consider decreasing to maintenance dose Eliquis 2.5 mg twice daily indefinitely.  The patient voices understanding of the plan moving forward.   73, MD Department of Hematology/Oncology [facility] at [facility] Phone: [phone] Pager: [phone] Email: [phone]

## 2022-08-09 NOTE — Progress Notes (Signed)
Vascular and Vein Specialist of   Patient name: Clifford Roberts MRN: [id number] DOB: 13-Aug-1956 Sex: male   REASON FOR VISIT:    Follow-up  HISOTRY OF PRESENT ILLNESS:    Clifford Roberts is a 66 y.o. male who presented to the hospital in August 2023 with a left leg DVT.  He initially began having trouble on July 19.  This consisted of swelling and discomfort.  On 822 he underwent mechanical thrombectomy using the INARI device.  He also had balloon venoplasty of the iliac, femoral, and popliteal veins.  He is back today for follow-up.  His swelling has improved but still persist.  He does wear his compression socks.  He has gotten back to working out at [employer].  There does appear to be a family history of clotting disorders.  He is a non-smoker.  He remains on Eliquis.  PAST MEDICAL HISTORY:   Past Medical History:  Diagnosis Date   Allergy    Hyperlipidemia    Hypertension    Osteoarthrosis, unspecified whether generalized or localized, unspecified site      FAMILY HISTORY:   History reviewed. No pertinent family history.  SOCIAL HISTORY:   Social History   Tobacco Use   Smoking status: Never   Smokeless tobacco: Never  Substance Use Topics   Alcohol use: Yes     ALLERGIES:   Allergies  Allergen Reactions   Prochlorperazine Other (See Comments)    Causes Stroke Symptoms     CURRENT MEDICATIONS:   Current Outpatient Medications  Medication Sig Dispense Refill   Adalimumab (HUMIRA) 40 MG/0.4ML PSKT Inject 40 mg into the skin every 14 (fourteen) days.     APIXABAN (ELIQUIS) VTE STARTER PACK (10MG  AND 5MG ) Take as directed on package: start with two-5mg  tablets twice daily for 7 days. On day 8, switch to one-5mg  tablet twice daily. 74 each 0   cholecalciferol (VITAMIN D3) 25 MCG (1000 UNIT) tablet Take 1,000 Units by mouth daily.     mesalamine (LIALDA) 1.2 g EC tablet Take 2.4 g by mouth in the morning and at bedtime.  Take 2 tablets (2.2 mg) BID     Nebivolol HCl 20 MG TABS Take 1 tablet by mouth daily.     predniSONE (DELTASONE) 10 MG tablet Take 40 mg by mouth daily as needed (colitis).     spironolactone (ALDACTONE) 25 MG tablet Take 25 mg by mouth 2 (two) times daily.     TURMERIC PO Take 1 capsule by mouth daily.     vitamin E 1000 UNIT capsule Take 1,000 Units by mouth daily.     No current facility-administered medications for this visit.    REVIEW OF SYSTEMS:   [X]  denotes positive finding, [ ]  denotes negative finding Cardiac  Comments:  Chest pain or chest pressure:    Shortness of breath upon exertion:    Short of breath when lying flat:    Irregular heart rhythm:        Vascular    Pain in calf, thigh, or hip brought on by ambulation:    Pain in feet at night that wakes you up from your sleep:     Blood clot in your veins:    Leg swelling:  x       Pulmonary    Oxygen at home:    Productive cough:     Wheezing:         Neurologic    Sudden weakness in arms or legs:  Sudden numbness in arms or legs:     Sudden onset of difficulty speaking or slurred speech:    Temporary loss of vision in one eye:     Problems with dizziness:         Gastrointestinal    Blood in stool:     Vomited blood:         Genitourinary    Burning when urinating:     Blood in urine:        Psychiatric    Major depression:         Hematologic    Bleeding problems:    Problems with blood clotting too easily:        Skin    Rashes or ulcers:        Constitutional    Fever or chills:      PHYSICAL EXAM:   Vitals:   08/09/22 0850  BP: (!) 153/79  Pulse: (!) 57  Resp: 20  Temp: 97.9 F (36.6 C)  SpO2: 97%  Weight: 207 lb 12.8 oz (94.3 kg)  Height: 6' (1.829 m)    GENERAL: The patient is a well-nourished male, in no acute distress. The vital signs are documented above. CARDIAC: There is a regular rate and rhythm.  VASCULAR: 1-2+ left leg edema PULMONARY: Non-labored  respirations MUSCULOSKELETAL: There are no major deformities or cyanosis. NEUROLOGIC: No focal weakness or paresthesias are detected. SKIN: There are no ulcers or rashes noted. PSYCHIATRIC: The patient has a normal affect.  STUDIES:   I have reviewed the following venous studies: IVC/Iliac: No evidence of thrombus in IVC and left Iliac veins.   LEFT:  - There is no evidence of deep vein thrombosis in the lower extremity.  - There is no evidence of superficial venous thrombosis.  - The posterior tibial and peroneal veins appear compressible but  visualization is technically difficult.   MEDICAL ISSUES:   Unprovoked left leg DVT: By ultrasound, all thrombus has been evacuated from his procedure.  He still has some edema to the left leg.  He remains on Eliquis.  I am sending him to hematology for thrombophilia work-up.  He will return in 6 months for repeat venous imaging    Annamarie Major, IV, MD, FACS [facility] [phone] Pager [phone]

## 2022-08-10 ENCOUNTER — Inpatient Hospital Stay: Payer: Medicare Other | Attending: Physician Assistant | Admitting: Physician Assistant

## 2022-08-10 ENCOUNTER — Inpatient Hospital Stay: Payer: Medicare Other

## 2022-08-10 ENCOUNTER — Encounter: Payer: Self-pay | Admitting: Physician Assistant

## 2022-08-10 VITALS — BP 128/72 | HR 69 | Temp 97.0°F | Resp 14 | Ht 72.0 in | Wt 210.9 lb

## 2022-08-10 DIAGNOSIS — Z801 Family history of malignant neoplasm of trachea, bronchus and lung: Secondary | ICD-10-CM | POA: Diagnosis not present

## 2022-08-10 DIAGNOSIS — F1090 Alcohol use, unspecified, uncomplicated: Secondary | ICD-10-CM | POA: Diagnosis not present

## 2022-08-10 DIAGNOSIS — Z7901 Long term (current) use of anticoagulants: Secondary | ICD-10-CM

## 2022-08-10 DIAGNOSIS — I82412 Acute embolism and thrombosis of left femoral vein: Secondary | ICD-10-CM | POA: Diagnosis not present

## 2022-08-10 DIAGNOSIS — I824Y2 Acute embolism and thrombosis of unspecified deep veins of left proximal lower extremity: Secondary | ICD-10-CM

## 2022-08-10 DIAGNOSIS — Z808 Family history of malignant neoplasm of other organs or systems: Secondary | ICD-10-CM | POA: Insufficient documentation

## 2022-08-10 DIAGNOSIS — I82432 Acute embolism and thrombosis of left popliteal vein: Secondary | ICD-10-CM

## 2022-08-10 DIAGNOSIS — Z832 Family history of diseases of the blood and blood-forming organs and certain disorders involving the immune mechanism: Secondary | ICD-10-CM | POA: Diagnosis not present

## 2022-08-10 DIAGNOSIS — I82442 Acute embolism and thrombosis of left tibial vein: Secondary | ICD-10-CM | POA: Diagnosis not present

## 2022-08-10 LAB — CMP (CANCER CENTER ONLY)
ALT: 18 U/L (ref 0–44)
AST: 25 U/L (ref 15–41)
Albumin: 4.1 g/dL (ref 3.5–5.0)
Alkaline Phosphatase: 38 U/L (ref 38–126)
Anion gap: 6 (ref 5–15)
BUN: 11 mg/dL (ref 8–23)
CO2: 30 mmol/L (ref 22–32)
Calcium: 9.2 mg/dL (ref 8.9–10.3)
Chloride: 104 mmol/L (ref 98–111)
Creatinine: 0.98 mg/dL (ref 0.61–1.24)
GFR, Estimated: >60 mL/min (ref >60)
Glucose, Bld: 87 mg/dL (ref 70–99)
Potassium: 3.8 mmol/L (ref 3.5–5.1)
Sodium: 140 mmol/L (ref 135–145)
Total Bilirubin: 1.3 mg/dL — ABNORMAL HIGH (ref 0.3–1.2)
Total Protein: 7.8 g/dL (ref 6.5–8.1)

## 2022-08-10 LAB — CBC WITH DIFFERENTIAL (CANCER CENTER ONLY)
Abs Immature Granulocytes: 0.01 10*3/uL (ref 0.00–0.07)
Basophils Absolute: 0 10*3/uL (ref 0.0–0.1)
Basophils Relative: 1 %
Eosinophils Absolute: 0.3 10*3/uL (ref 0.0–0.5)
Eosinophils Relative: 6 %
HCT: 39.5 % (ref 39.0–52.0)
Hemoglobin: 13.6 g/dL (ref 13.0–17.0)
Immature Granulocytes: 0 %
Lymphocytes Relative: 43 %
Lymphs Abs: 2.1 10*3/uL (ref 0.7–4.0)
MCH: 30.9 pg (ref 26.0–34.0)
MCHC: 34.4 g/dL (ref 30.0–36.0)
MCV: 89.8 fL (ref 80.0–100.0)
Monocytes Absolute: 0.6 10*3/uL (ref 0.1–1.0)
Monocytes Relative: 11 %
Neutro Abs: 1.9 10*3/uL (ref 1.7–7.7)
Neutrophils Relative %: 39 %
Platelet Count: 154 10*3/uL (ref 150–400)
RBC: 4.4 MIL/uL (ref 4.22–5.81)
RDW: 13.4 % (ref 11.5–15.5)
WBC Count: 4.9 10*3/uL (ref 4.0–10.5)
nRBC: 0 % (ref 0.0–0.2)

## 2022-08-11 ENCOUNTER — Other Ambulatory Visit: Payer: Self-pay

## 2022-08-11 DIAGNOSIS — I824Y2 Acute embolism and thrombosis of unspecified deep veins of left proximal lower extremity: Secondary | ICD-10-CM

## 2022-08-11 LAB — CARDIOLIPIN ANTIBODIES, IGG, IGM, IGA
Anticardiolipin IgA: <9 APL U/mL (ref 0–11)
Anticardiolipin IgG: <9 GPL U/mL (ref 0–14)
Anticardiolipin IgM: <9 MPL U/mL (ref 0–12)

## 2022-08-12 LAB — BETA-2-GLYCOPROTEIN I ABS, IGG/M/A
Beta-2 Glyco I IgG: <9 GPI IgG units (ref 0–20)
Beta-2-Glycoprotein I IgA: <9 GPI IgA units (ref 0–25)
Beta-2-Glycoprotein I IgM: <9 GPI IgM units (ref 0–32)

## 2022-08-16 LAB — PROTHROMBIN GENE MUTATION

## 2022-08-18 ENCOUNTER — Telehealth: Payer: Self-pay | Admitting: Physician Assistant

## 2022-08-18 LAB — FACTOR 5 LEIDEN

## 2022-08-18 NOTE — Telephone Encounter (Signed)
I called Mr. Clifford Roberts to review the lab results from 08/10/2022.  Findings show no evidence of antiphospholipid syndrome, factor V Leiden mutation or prothrombin gene mutation.  Recommend to continue Eliquis 5 mg twice daily.

## 2022-11-22 ENCOUNTER — Telehealth: Payer: Self-pay | Admitting: Physician Assistant

## 2022-11-22 NOTE — Telephone Encounter (Signed)
Called patient to r/s 3/1 appointment due to provider PAL. Patient r/s and notified. Mailing reminder.

## 2023-01-07 ENCOUNTER — Telehealth: Payer: Self-pay | Admitting: Physician Assistant

## 2023-01-07 NOTE — Telephone Encounter (Signed)
Called patient regarding march appointment, patient is notified.

## 2023-01-14 ENCOUNTER — Ambulatory Visit: Payer: Medicare Other | Admitting: Physician Assistant

## 2023-01-14 ENCOUNTER — Other Ambulatory Visit: Payer: Medicare Other

## 2023-01-20 ENCOUNTER — Other Ambulatory Visit: Payer: Self-pay | Admitting: Physician Assistant

## 2023-01-20 DIAGNOSIS — I824Y2 Acute embolism and thrombosis of unspecified deep veins of left proximal lower extremity: Secondary | ICD-10-CM

## 2023-01-21 ENCOUNTER — Inpatient Hospital Stay: Payer: Medicare Other | Attending: Internal Medicine

## 2023-01-21 ENCOUNTER — Inpatient Hospital Stay: Payer: Medicare Other | Admitting: Physician Assistant

## 2023-12-19 ENCOUNTER — Other Ambulatory Visit: Payer: Self-pay

## 2023-12-19 ENCOUNTER — Emergency Department (HOSPITAL_BASED_OUTPATIENT_CLINIC_OR_DEPARTMENT_OTHER): Payer: Medicare Other | Admitting: Radiology

## 2023-12-19 ENCOUNTER — Emergency Department (HOSPITAL_BASED_OUTPATIENT_CLINIC_OR_DEPARTMENT_OTHER): Payer: Medicare Other

## 2023-12-19 ENCOUNTER — Encounter (HOSPITAL_BASED_OUTPATIENT_CLINIC_OR_DEPARTMENT_OTHER): Payer: Self-pay | Admitting: Emergency Medicine

## 2023-12-19 ENCOUNTER — Emergency Department (HOSPITAL_BASED_OUTPATIENT_CLINIC_OR_DEPARTMENT_OTHER)
Admission: EM | Admit: 2023-12-19 | Discharge: 2023-12-19 | Disposition: A | Discharge status: Home / Self Care | Payer: Medicare Other | Attending: Emergency Medicine | Admitting: Emergency Medicine

## 2023-12-19 DIAGNOSIS — I10 Essential (primary) hypertension: Secondary | ICD-10-CM | POA: Insufficient documentation

## 2023-12-19 DIAGNOSIS — R9431 Abnormal electrocardiogram [ECG] [EKG]: Secondary | ICD-10-CM | POA: Diagnosis not present

## 2023-12-19 DIAGNOSIS — Z79899 Other long term (current) drug therapy: Secondary | ICD-10-CM | POA: Diagnosis not present

## 2023-12-19 DIAGNOSIS — U071 COVID-19: Secondary | ICD-10-CM | POA: Diagnosis not present

## 2023-12-19 DIAGNOSIS — I119 Hypertensive heart disease without heart failure: Secondary | ICD-10-CM | POA: Diagnosis not present

## 2023-12-19 DIAGNOSIS — J069 Acute upper respiratory infection, unspecified: Secondary | ICD-10-CM | POA: Diagnosis not present

## 2023-12-19 DIAGNOSIS — Z7901 Long term (current) use of anticoagulants: Secondary | ICD-10-CM | POA: Insufficient documentation

## 2023-12-19 DIAGNOSIS — R0989 Other specified symptoms and signs involving the circulatory and respiratory systems: Secondary | ICD-10-CM | POA: Diagnosis not present

## 2023-12-19 DIAGNOSIS — E7800 Pure hypercholesterolemia, unspecified: Secondary | ICD-10-CM | POA: Diagnosis not present

## 2023-12-19 DIAGNOSIS — R0981 Nasal congestion: Secondary | ICD-10-CM | POA: Diagnosis present

## 2023-12-19 DIAGNOSIS — I2724 Chronic thromboembolic pulmonary hypertension: Secondary | ICD-10-CM | POA: Diagnosis not present

## 2023-12-19 DIAGNOSIS — K5190 Ulcerative colitis, unspecified, without complications: Secondary | ICD-10-CM | POA: Diagnosis not present

## 2023-12-19 DIAGNOSIS — K056 Periodontal disease, unspecified: Secondary | ICD-10-CM | POA: Diagnosis not present

## 2023-12-19 LAB — BASIC METABOLIC PANEL
Anion gap: 13 (ref 5–15)
BUN: 28 mg/dL — ABNORMAL HIGH (ref 8–23)
CO2: 26 mmol/L (ref 22–32)
Calcium: 9.5 mg/dL (ref 8.9–10.3)
Chloride: 97 mmol/L — ABNORMAL LOW (ref 98–111)
Creatinine, Ser: 1.02 mg/dL (ref 0.61–1.24)
GFR, Estimated: >60 mL/min (ref >60)
Glucose, Bld: 116 mg/dL — ABNORMAL HIGH (ref 70–99)
Potassium: 3.2 mmol/L — ABNORMAL LOW (ref 3.5–5.1)
Sodium: 136 mmol/L (ref 135–145)

## 2023-12-19 LAB — RESP PANEL BY RT-PCR (RSV, FLU A&B, COVID)  RVPGX2
Influenza A by PCR: NEGATIVE
Influenza B by PCR: NEGATIVE
Resp Syncytial Virus by PCR: NEGATIVE
SARS Coronavirus 2 by RT PCR: POSITIVE — AB

## 2023-12-19 LAB — CBC
HCT: 43.2 % (ref 39.0–52.0)
Hemoglobin: 14.8 g/dL (ref 13.0–17.0)
MCH: 31.7 pg (ref 26.0–34.0)
MCHC: 34.3 g/dL (ref 30.0–36.0)
MCV: 92.5 fL (ref 80.0–100.0)
Platelets: 142 10*3/uL — ABNORMAL LOW (ref 150–400)
RBC: 4.67 MIL/uL (ref 4.22–5.81)
RDW: 13.2 % (ref 11.5–15.5)
WBC: 5.8 10*3/uL (ref 4.0–10.5)
nRBC: 0 % (ref 0.0–0.2)

## 2023-12-19 MED ORDER — ONDANSETRON 4 MG PO TBDP: 4.0000 mg | ORAL_TABLET | Freq: Three times a day (TID) | ORAL | 0 refills | Status: AC | PRN

## 2023-12-19 NOTE — Discharge Instructions (Addendum)
Thank you for letting us evaluate you today. You tested positive for COVID. Please make sure to continue masking to reduce spread. Drink plenty of water, gatorade, tea with honey, pedialyte, juice (less sugar if possible), chicken broth.  Use tylenol or ibuprofen every four hrs as needed for fever, body aches  Return to ED if you experience significant worsening of symptoms, chest pain, or shortness of breath

## 2023-12-19 NOTE — ED Provider Notes (Signed)
[place] EMERGENCY DEPARTMENT AT [facility] Provider Note   CSN: [id number] Arrival date & time: 12/19/23  1149     History {Add pertinent medical, surgical, social history, OB history to HPI:1} Chief Complaint  Patient presents with   Congestion    Clifford Roberts is a 68 y.o. male. Pt complains of nasal congestion, hiccups, nausea since Thursday. One episode of vomiting on Thursday.   PCP appointment on 01/02/24  HPI     Home Medications Prior to Admission medications   Medication Sig Start Date End Date Taking? Authorizing Provider  Adalimumab (HUMIRA) 40 MG/0.4ML PSKT Inject 40 mg into the skin every 14 (fourteen) days.    [provider]  apixaban (ELIQUIS) 5 MG TABS tablet Take 5 mg by mouth 2 (two) times daily.    [provider]  cholecalciferol (VITAMIN D3) 25 MCG (1000 UNIT) tablet Take 1,000 Units by mouth daily.    [provider]  mesalamine (LIALDA) 1.2 g EC tablet Take 2.4 g by mouth in the morning and at bedtime. Take 2 tablets (2.2 mg) BID 12/11/20   [provider]  Nebivolol HCl 20 MG TABS Take 1 tablet by mouth daily. 05/09/22   [provider]  spironolactone (ALDACTONE) 25 MG tablet Take 25 mg by mouth 2 (two) times daily. 03/13/22   [provider]  TURMERIC PO Take 1 capsule by mouth daily.    [provider]  vitamin E 1000 UNIT capsule Take 1,000 Units by mouth daily.    [provider]      Allergies    Prochlorperazine    Review of Systems   Review of Systems  Physical Exam Updated Vital Signs BP 134/74 (BP Location: Right Arm)   Pulse 73   Temp 99.8 F (37.7 C) (Oral)   Resp 18   Wt 93.4 kg   SpO2 96%   BMI 27.94 kg/m  Physical Exam  ED Results / Procedures / Treatments   Labs (all labs ordered are listed, but only abnormal results are displayed) Labs Reviewed  RESP PANEL BY RT-PCR (RSV, FLU A&B, COVID)  RVPGX2 - Abnormal; Notable for the following  components:      Result Value   SARS Coronavirus 2 by RT PCR POSITIVE (*)    All other components within normal limits  CBC - Abnormal; Notable for the following components:   Platelets 142 (*)    All other components within normal limits  BASIC METABOLIC PANEL - Abnormal; Notable for the following components:   Potassium 3.2 (*)    Chloride 97 (*)    Glucose, Bld 116 (*)    BUN 28 (*)    All other components within normal limits    EKG None  Radiology No results found.  Procedures Procedures  {Document cardiac monitor, telemetry assessment procedure when appropriate:1}  Medications Ordered in ED Medications - No data to display  ED Course/ Medical Decision Making/ A&P   {   Click here for ABCD2, HEART and other calculatorsREFRESH Note before signing :1}                              Medical Decision Making  ***  {Document critical care time when appropriate:1} {Document review of labs and clinical decision tools ie heart score, Chads2Vasc2 etc:1}  {Document your independent review of radiology images, and any outside records:1} {Document your discussion with family members, caretakers, and with consultants:1} {  Document social determinants of health affecting pt's care:1} {Document your decision making why or why not admission, treatments were needed:1} Final Clinical Impression(s) / ED Diagnoses Final diagnoses:  COVID    Rx / DC Orders ED Discharge Orders     None

## 2023-12-19 NOTE — ED Triage Notes (Signed)
Congestion, hiccups. Denies body aches, chills, +n/+v on Thursday.

## 2023-12-19 NOTE — ED Notes (Signed)
Blue top sent with labs. ?

## 2024-01-02 DIAGNOSIS — I119 Hypertensive heart disease without heart failure: Secondary | ICD-10-CM | POA: Diagnosis not present

## 2024-01-02 DIAGNOSIS — E876 Hypokalemia: Secondary | ICD-10-CM | POA: Diagnosis not present

## 2024-01-02 DIAGNOSIS — K5190 Ulcerative colitis, unspecified, without complications: Secondary | ICD-10-CM | POA: Diagnosis not present

## 2024-01-02 DIAGNOSIS — K056 Periodontal disease, unspecified: Secondary | ICD-10-CM | POA: Diagnosis not present

## 2024-01-02 DIAGNOSIS — K4090 Unilateral inguinal hernia, without obstruction or gangrene, not specified as recurrent: Secondary | ICD-10-CM | POA: Diagnosis not present

## 2024-01-02 DIAGNOSIS — Z8616 Personal history of COVID-19: Secondary | ICD-10-CM | POA: Diagnosis not present

## 2024-01-02 DIAGNOSIS — I2724 Chronic thromboembolic pulmonary hypertension: Secondary | ICD-10-CM | POA: Diagnosis not present

## 2024-01-02 DIAGNOSIS — E7800 Pure hypercholesterolemia, unspecified: Secondary | ICD-10-CM | POA: Diagnosis not present

## 2024-01-06 DIAGNOSIS — K5190 Ulcerative colitis, unspecified, without complications: Secondary | ICD-10-CM | POA: Diagnosis not present

## 2024-01-10 DIAGNOSIS — K5190 Ulcerative colitis, unspecified, without complications: Secondary | ICD-10-CM | POA: Diagnosis not present

## 2024-01-16 DIAGNOSIS — K5190 Ulcerative colitis, unspecified, without complications: Secondary | ICD-10-CM | POA: Diagnosis not present

## 2024-01-25 DIAGNOSIS — K5190 Ulcerative colitis, unspecified, without complications: Secondary | ICD-10-CM | POA: Diagnosis not present

## 2024-02-27 DIAGNOSIS — I2724 Chronic thromboembolic pulmonary hypertension: Secondary | ICD-10-CM | POA: Diagnosis not present

## 2024-02-27 DIAGNOSIS — Z8616 Personal history of COVID-19: Secondary | ICD-10-CM | POA: Diagnosis not present

## 2024-02-27 DIAGNOSIS — E7800 Pure hypercholesterolemia, unspecified: Secondary | ICD-10-CM | POA: Diagnosis not present

## 2024-02-27 DIAGNOSIS — E876 Hypokalemia: Secondary | ICD-10-CM | POA: Diagnosis not present

## 2024-02-27 DIAGNOSIS — K056 Periodontal disease, unspecified: Secondary | ICD-10-CM | POA: Diagnosis not present

## 2024-02-27 DIAGNOSIS — K4090 Unilateral inguinal hernia, without obstruction or gangrene, not specified as recurrent: Secondary | ICD-10-CM | POA: Diagnosis not present

## 2024-02-27 DIAGNOSIS — I119 Hypertensive heart disease without heart failure: Secondary | ICD-10-CM | POA: Diagnosis not present

## 2024-02-27 DIAGNOSIS — K5190 Ulcerative colitis, unspecified, without complications: Secondary | ICD-10-CM | POA: Diagnosis not present

## 2024-03-02 DIAGNOSIS — K5190 Ulcerative colitis, unspecified, without complications: Secondary | ICD-10-CM | POA: Diagnosis not present

## 2024-03-06 DIAGNOSIS — K5190 Ulcerative colitis, unspecified, without complications: Secondary | ICD-10-CM | POA: Diagnosis not present

## 2024-05-01 DIAGNOSIS — K5190 Ulcerative colitis, unspecified, without complications: Secondary | ICD-10-CM | POA: Diagnosis not present

## 2024-05-31 DIAGNOSIS — E7800 Pure hypercholesterolemia, unspecified: Secondary | ICD-10-CM | POA: Diagnosis not present

## 2024-05-31 DIAGNOSIS — K4090 Unilateral inguinal hernia, without obstruction or gangrene, not specified as recurrent: Secondary | ICD-10-CM | POA: Diagnosis not present

## 2024-05-31 DIAGNOSIS — I119 Hypertensive heart disease without heart failure: Secondary | ICD-10-CM | POA: Diagnosis not present

## 2024-05-31 DIAGNOSIS — I2724 Chronic thromboembolic pulmonary hypertension: Secondary | ICD-10-CM | POA: Diagnosis not present

## 2024-05-31 DIAGNOSIS — K5190 Ulcerative colitis, unspecified, without complications: Secondary | ICD-10-CM | POA: Diagnosis not present

## 2024-05-31 DIAGNOSIS — K056 Periodontal disease, unspecified: Secondary | ICD-10-CM | POA: Diagnosis not present

## 2024-05-31 DIAGNOSIS — E876 Hypokalemia: Secondary | ICD-10-CM | POA: Diagnosis not present

## 2024-06-25 DIAGNOSIS — K5190 Ulcerative colitis, unspecified, without complications: Secondary | ICD-10-CM | POA: Diagnosis not present

## 2024-08-06 DIAGNOSIS — K5150 Left sided colitis without complications: Secondary | ICD-10-CM | POA: Diagnosis not present

## 2024-08-20 DIAGNOSIS — R7303 Prediabetes: Secondary | ICD-10-CM | POA: Diagnosis not present

## 2024-08-20 DIAGNOSIS — E782 Mixed hyperlipidemia: Secondary | ICD-10-CM | POA: Diagnosis not present

## 2024-08-20 DIAGNOSIS — K5150 Left sided colitis without complications: Secondary | ICD-10-CM | POA: Diagnosis not present

## 2024-08-20 DIAGNOSIS — I2724 Chronic thromboembolic pulmonary hypertension: Secondary | ICD-10-CM | POA: Diagnosis not present

## 2024-08-20 DIAGNOSIS — K5190 Ulcerative colitis, unspecified, without complications: Secondary | ICD-10-CM | POA: Diagnosis not present

## 2024-08-20 DIAGNOSIS — I119 Hypertensive heart disease without heart failure: Secondary | ICD-10-CM | POA: Diagnosis not present

## 2024-08-21 DIAGNOSIS — K5190 Ulcerative colitis, unspecified, without complications: Secondary | ICD-10-CM | POA: Diagnosis not present

## 2024-08-30 DIAGNOSIS — K4090 Unilateral inguinal hernia, without obstruction or gangrene, not specified as recurrent: Secondary | ICD-10-CM | POA: Diagnosis not present

## 2024-08-30 DIAGNOSIS — I2724 Chronic thromboembolic pulmonary hypertension: Secondary | ICD-10-CM | POA: Diagnosis not present

## 2024-08-30 DIAGNOSIS — E7800 Pure hypercholesterolemia, unspecified: Secondary | ICD-10-CM | POA: Diagnosis not present

## 2024-08-30 DIAGNOSIS — K056 Periodontal disease, unspecified: Secondary | ICD-10-CM | POA: Diagnosis not present

## 2024-08-30 DIAGNOSIS — K5190 Ulcerative colitis, unspecified, without complications: Secondary | ICD-10-CM | POA: Diagnosis not present

## 2024-08-30 DIAGNOSIS — I119 Hypertensive heart disease without heart failure: Secondary | ICD-10-CM | POA: Diagnosis not present
